# Patient Record
Sex: Male | Born: 1950 | Race: White | Hispanic: No | Marital: Married | Smoking: Never smoker
Health system: Southern US, Community
[De-identification: ages and names within clinical notes are randomized; demographics above are authoritative.]

## PROBLEM LIST (undated history)

## (undated) DIAGNOSIS — E119 Type 2 diabetes mellitus without complications: Secondary | ICD-10-CM

## (undated) DIAGNOSIS — M1990 Unspecified osteoarthritis, unspecified site: Secondary | ICD-10-CM

## (undated) DIAGNOSIS — M7022 Olecranon bursitis, left elbow: Principal | ICD-10-CM

## (undated) HISTORY — DX: Olecranon bursitis, left elbow: M70.22

## (undated) HISTORY — DX: Type 2 diabetes mellitus without complications: E11.9

---

## 2003-10-21 ENCOUNTER — Ambulatory Visit (HOSPITAL_COMMUNITY): Admission: RE | Admit: 2003-10-21 | Discharge: 2003-10-21 | Payer: Self-pay | Admitting: Internal Medicine

## 2015-03-24 ENCOUNTER — Other Ambulatory Visit: Payer: Self-pay | Admitting: Internal Medicine

## 2015-03-24 ENCOUNTER — Ambulatory Visit
Admission: RE | Admit: 2015-03-24 | Discharge: 2015-03-24 | Disposition: A | Discharge status: Home / Self Care | Payer: Federal, State, Local not specified - PPO | Source: Ambulatory Visit | Attending: Internal Medicine | Admitting: Internal Medicine

## 2015-03-24 DIAGNOSIS — M25511 Pain in right shoulder: Secondary | ICD-10-CM

## 2016-06-21 ENCOUNTER — Ambulatory Visit (INDEPENDENT_AMBULATORY_CARE_PROVIDER_SITE_OTHER): Payer: Federal, State, Local not specified - PPO | Admitting: Podiatry

## 2016-06-21 ENCOUNTER — Encounter: Payer: Self-pay | Admitting: Podiatry

## 2016-06-21 VITALS — BP 124/84 | HR 75 | Resp 18

## 2016-06-21 DIAGNOSIS — L89891 Pressure ulcer of other site, stage 1: Secondary | ICD-10-CM

## 2016-06-21 DIAGNOSIS — L97511 Non-pressure chronic ulcer of other part of right foot limited to breakdown of skin: Secondary | ICD-10-CM

## 2016-06-21 MED ORDER — MUPIROCIN 2 % EX OINT: 1.0000 "application " | TOPICAL_OINTMENT | Freq: Two times a day (BID) | CUTANEOUS | Status: DC

## 2016-06-21 MED ORDER — CEPHALEXIN 500 MG PO CAPS: 500.0000 mg | ORAL_CAPSULE | Freq: Three times a day (TID) | ORAL | Status: DC

## 2016-06-21 NOTE — Progress Notes (Signed)
   Subjective:    Patient ID: Lawrence Keith, male    DOB: 08/29/1951, 65 y.o.   MRN: [id number]  HPI  65 year old male presents the office today for concerns of a wound to the bottom of his right big toe which she just noticed yesterday. He states he had a blister to the area previously and yesterday he noticed that there was a wound there. He is unsure of how the blister started he is diabetic and his last A1c was 7.1. He does have a history of blistering on that foot as well previously. He does have numbness and tingling but is not taking any medication for neuropathy. Denies any claudication symptoms.   Review of Systems  All other systems reviewed and are negative.      Objective:   Physical Exam General: AAO x3, NAD  Dermatological: On the plantar aspect of the right hallux is a superficial granular ulceration with surrounding hyperkeratotic tissue. After debridement the area measures 0.8 x 0.8 cm. There is no fluctuance or crepitus. There is no malodor. There is no ascending synovitis. There is no probing, undermining or tunneling. There is no edema or erythema. No other open lesions or pre-ulcerative lesions identified this time.  Vascular: Dorsalis Pedis artery and Posterior Tibial artery pedal pulses are 2/4 bilateral with immedate capillary fill time. Pedal hair growth present. No varicosities and no lower extremity edema present bilateral. There is no pain with calf compression, swelling, warmth, erythema.   Neruologic: Sensation appears to be intact with Dorann OuSimms Weinstein monofilament as well as vibratory sensation. Does have subjective symptoms of neuropathy however.  Musculoskeletal: No gross boney pedal deformities bilateral. No pain, crepitus, or limitation noted with foot and ankle range of motion bilateral. Muscular strength 5/5 in all groups tested bilateral.  Gait: Unassisted, Nonantalgic.     Assessment & Plan:  65 year old male right plantar hallux ulceration due  to blister -Treatment options discussed including all alternatives, risks, and complications -Etiology of symptoms were discussed -Hyperkeratotic tissue and the wound was debrided to granular tissue with a scalpel. Continue daily dressing changes with the Pierson ointment. Prescribed Keflex. Monitor for signs or symptoms of infection and directed to the ER should any occur. Also discussed shoe gear modifications. -Follow-up as scheduled or sooner if any issues are to arise. In the meantime I encouraged him to call any questions, concerns or any change in symptoms.  Ovid CurdMatthew Wagoner, DPM

## 2016-06-24 ENCOUNTER — Encounter: Payer: Self-pay | Admitting: Podiatry

## 2016-07-05 ENCOUNTER — Ambulatory Visit: Payer: Federal, State, Local not specified - PPO | Admitting: Podiatry

## 2016-07-12 ENCOUNTER — Ambulatory Visit (INDEPENDENT_AMBULATORY_CARE_PROVIDER_SITE_OTHER): Payer: Federal, State, Local not specified - PPO | Admitting: Podiatry

## 2016-07-12 ENCOUNTER — Encounter: Payer: Self-pay | Admitting: Podiatry

## 2016-07-12 DIAGNOSIS — L97511 Non-pressure chronic ulcer of other part of right foot limited to breakdown of skin: Secondary | ICD-10-CM | POA: Diagnosis not present

## 2016-07-13 DIAGNOSIS — L97519 Non-pressure chronic ulcer of other part of right foot with unspecified severity: Secondary | ICD-10-CM | POA: Insufficient documentation

## 2016-07-13 NOTE — Progress Notes (Signed)
Subjective: 28 old male presents the office in follow-up evaluation of right hallux ulceration. He states that the area is much better. He states the areas almost cleaning his been cleaning the wound daily implying and buttock ointment and a bandage. Denies any edema, erythema or pain. No drainage or pus. Denies any systemic complaints such as fevers, chills, nausea, vomiting. No acute changes since last appointment, and no other complaints at this time.   Objective: AAO x3, NAD DP/PT pulses palpable bilaterally, CRT less than 3 seconds On the plantar aspect of the right hallux is a small superficial granular wound measuring approximately 0.2 x 0.27 m. There is no probing, undermining or tunneling. There is no edema, erythema, fluctuance, crepitus, malodor. No drainage or pus.  No edema, erythema, increase in warmth to bilateral lower extremities.  No open lesions or pre-ulcerative lesions.  No pain with calf compression, swelling, warmth, erythema  Assessment: Resolving ulceration right hallux to blister  Plan: -All treatment options discussed with the patient including all alternatives, risks, complications.  -There is a small amount of hyperkeratotic tissue around the wound which was debrided today. Recommend continue interbody ointment dressing changes daily. I modified his insert and take pressure off this area as well. Monitor for signs or symptoms of infection. The area is not healed within 3 weeks for follow-up or sooner if any issues are to arise. -Patient encouraged to call the office with any questions, concerns, change in symptoms.   Ovid Curd, DPM

## 2016-07-31 ENCOUNTER — Emergency Department (HOSPITAL_COMMUNITY): Payer: Federal, State, Local not specified - PPO

## 2016-07-31 ENCOUNTER — Observation Stay (HOSPITAL_COMMUNITY): Payer: Federal, State, Local not specified - PPO | Admitting: Anesthesiology

## 2016-07-31 ENCOUNTER — Inpatient Hospital Stay (HOSPITAL_COMMUNITY)
Admission: EM | Admit: 2016-07-31 | Discharge: 2016-08-03 | DRG: 419 | Disposition: A | Discharge status: Home / Self Care | Payer: Federal, State, Local not specified - PPO | Attending: Surgery | Admitting: Surgery

## 2016-07-31 ENCOUNTER — Encounter (HOSPITAL_COMMUNITY): Payer: Self-pay | Admitting: Emergency Medicine

## 2016-07-31 ENCOUNTER — Observation Stay (HOSPITAL_COMMUNITY): Payer: Federal, State, Local not specified - PPO

## 2016-07-31 ENCOUNTER — Encounter (HOSPITAL_COMMUNITY): Admission: EM | Disposition: A | Discharge status: Home / Self Care | Payer: Self-pay | Source: Home / Self Care

## 2016-07-31 DIAGNOSIS — K8020 Calculus of gallbladder without cholecystitis without obstruction: Secondary | ICD-10-CM | POA: Diagnosis not present

## 2016-07-31 DIAGNOSIS — K8066 Calculus of gallbladder and bile duct with acute and chronic cholecystitis without obstruction: Principal | ICD-10-CM | POA: Diagnosis present

## 2016-07-31 DIAGNOSIS — Z79899 Other long term (current) drug therapy: Secondary | ICD-10-CM

## 2016-07-31 DIAGNOSIS — E119 Type 2 diabetes mellitus without complications: Secondary | ICD-10-CM | POA: Diagnosis present

## 2016-07-31 DIAGNOSIS — Z7984 Long term (current) use of oral hypoglycemic drugs: Secondary | ICD-10-CM

## 2016-07-31 DIAGNOSIS — I10 Essential (primary) hypertension: Secondary | ICD-10-CM | POA: Diagnosis present

## 2016-07-31 DIAGNOSIS — Z419 Encounter for procedure for purposes other than remedying health state, unspecified: Secondary | ICD-10-CM

## 2016-07-31 DIAGNOSIS — Z7982 Long term (current) use of aspirin: Secondary | ICD-10-CM

## 2016-07-31 HISTORY — PX: CHOLECYSTECTOMY: SHX55

## 2016-07-31 LAB — COMPREHENSIVE METABOLIC PANEL
ALBUMIN: 4.1 g/dL (ref 3.5–5.0)
ALK PHOS: 51 U/L (ref 38–126)
ALT: 18 U/L (ref 17–63)
ANION GAP: 9 (ref 5–15)
AST: 20 U/L (ref 15–41)
BILIRUBIN TOTAL: 0.6 mg/dL (ref 0.3–1.2)
BUN: 19 mg/dL (ref 6–20)
CALCIUM: 9.3 mg/dL (ref 8.9–10.3)
CO2: 28 mmol/L (ref 22–32)
Chloride: 104 mmol/L (ref 101–111)
Creatinine, Ser: 1.25 mg/dL — ABNORMAL HIGH (ref 0.61–1.24)
GFR calc Af Amer: >60 mL/min (ref >60)
GFR calc non Af Amer: 60 mL/min — ABNORMAL LOW (ref >60)
GLUCOSE: 208 mg/dL — AB (ref 65–99)
Potassium: 3.6 mmol/L (ref 3.5–5.1)
Sodium: 141 mmol/L (ref 135–145)
TOTAL PROTEIN: 6.5 g/dL (ref 6.5–8.1)

## 2016-07-31 LAB — CBC
HCT: 41.8 % (ref 39.0–52.0)
HEMOGLOBIN: 13.7 g/dL (ref 13.0–17.0)
MCH: 30.4 pg (ref 26.0–34.0)
MCHC: 32.8 g/dL (ref 30.0–36.0)
MCV: 92.7 fL (ref 78.0–100.0)
Platelets: 184 10*3/uL (ref 150–400)
RBC: 4.51 MIL/uL (ref 4.22–5.81)
RDW: 12.3 % (ref 11.5–15.5)
WBC: 9.8 10*3/uL (ref 4.0–10.5)

## 2016-07-31 LAB — URINALYSIS, ROUTINE W REFLEX MICROSCOPIC
BILIRUBIN URINE: NEGATIVE
Glucose, UA: 250 mg/dL — AB
Hgb urine dipstick: NEGATIVE
KETONES UR: 15 mg/dL — AB
Leukocytes, UA: NEGATIVE
NITRITE: NEGATIVE
PROTEIN: NEGATIVE mg/dL
SPECIFIC GRAVITY, URINE: 1.025 (ref 1.005–1.030)
pH: 5 (ref 5.0–8.0)

## 2016-07-31 LAB — SURGICAL PCR SCREEN
MRSA, PCR: NEGATIVE
Staphylococcus aureus: NEGATIVE

## 2016-07-31 LAB — CBG MONITORING, ED: GLUCOSE-CAPILLARY: 236 mg/dL — AB (ref 65–99)

## 2016-07-31 LAB — GLUCOSE, CAPILLARY
GLUCOSE-CAPILLARY: 191 mg/dL — AB (ref 65–99)
GLUCOSE-CAPILLARY: 225 mg/dL — AB (ref 65–99)
Glucose-Capillary: 191 mg/dL — ABNORMAL HIGH (ref 65–99)
Glucose-Capillary: 256 mg/dL — ABNORMAL HIGH (ref 65–99)

## 2016-07-31 LAB — LIPASE, BLOOD: Lipase: 17 U/L (ref 11–51)

## 2016-07-31 SURGERY — LAPAROSCOPIC CHOLECYSTECTOMY WITH INTRAOPERATIVE CHOLANGIOGRAM
Anesthesia: General | Site: Abdomen

## 2016-07-31 MED ORDER — MUPIROCIN 2 % EX OINT: 1.0000 "application " | TOPICAL_OINTMENT | Freq: Two times a day (BID) | CUTANEOUS | Status: DC | PRN

## 2016-07-31 MED ORDER — DIPHENHYDRAMINE HCL 12.5 MG/5ML PO ELIX: 12.5000 mg | ORAL_SOLUTION | Freq: Four times a day (QID) | ORAL | Status: DC | PRN

## 2016-07-31 MED ORDER — ONDANSETRON 4 MG PO TBDP: 4.0000 mg | ORAL_TABLET | Freq: Four times a day (QID) | ORAL | Status: DC | PRN

## 2016-07-31 MED ORDER — ONDANSETRON HCL 4 MG/2ML IJ SOLN
INTRAMUSCULAR | Status: AC
  Administered 2016-07-31: 4 mg
  Filled 2016-07-31: qty 2

## 2016-07-31 MED ORDER — SODIUM CHLORIDE 0.9 % IV SOLN
INTRAVENOUS | Status: DC | PRN
  Administered 2016-07-31: 14:00:00
  Administered 2016-07-31: 100 mL

## 2016-07-31 MED ORDER — BUPIVACAINE-EPINEPHRINE (PF) 0.25% -1:200000 IJ SOLN
INTRAMUSCULAR | Status: AC
  Filled 2016-07-31: qty 30

## 2016-07-31 MED ORDER — DEXTROSE 5 % IV SOLN
2.0000 g | INTRAVENOUS | Status: DC
  Administered 2016-07-31: 2 g via INTRAVENOUS
  Filled 2016-07-31: qty 2

## 2016-07-31 MED ORDER — SUGAMMADEX SODIUM 200 MG/2ML IV SOLN
INTRAVENOUS | Status: DC | PRN
  Administered 2016-07-31: 180 mg via INTRAVENOUS

## 2016-07-31 MED ORDER — PROPOFOL 10 MG/ML IV BOLUS
INTRAVENOUS | Status: AC
  Filled 2016-07-31: qty 20

## 2016-07-31 MED ORDER — 0.9 % SODIUM CHLORIDE (POUR BTL) OPTIME
TOPICAL | Status: DC | PRN
  Administered 2016-07-31: 1000 mL

## 2016-07-31 MED ORDER — FENTANYL CITRATE (PF) 100 MCG/2ML IJ SOLN
INTRAMUSCULAR | Status: AC
  Filled 2016-07-31: qty 2

## 2016-07-31 MED ORDER — OXYCODONE HCL 5 MG PO TABS
5.0000 mg | ORAL_TABLET | ORAL | Status: DC | PRN
  Administered 2016-07-31 – 2016-08-02 (×4): 10 mg via ORAL
  Filled 2016-07-31 (×6): qty 2

## 2016-07-31 MED ORDER — BUPIVACAINE-EPINEPHRINE 0.25% -1:200000 IJ SOLN
INTRAMUSCULAR | Status: DC | PRN
  Administered 2016-07-31: 8 mL

## 2016-07-31 MED ORDER — GLIMEPIRIDE 2 MG PO TABS
2.0000 mg | ORAL_TABLET | Freq: Every day | ORAL | Status: DC
  Administered 2016-08-01 – 2016-08-03 (×3): 2 mg via ORAL
  Filled 2016-07-31 (×3): qty 1

## 2016-07-31 MED ORDER — MIDAZOLAM HCL 2 MG/2ML IJ SOLN
INTRAMUSCULAR | Status: AC
  Filled 2016-07-31: qty 2

## 2016-07-31 MED ORDER — FENTANYL CITRATE (PF) 100 MCG/2ML IJ SOLN
50.0000 ug | Freq: Once | INTRAMUSCULAR | Status: AC
  Administered 2016-07-31: 50 ug via INTRAVENOUS
  Filled 2016-07-31: qty 2

## 2016-07-31 MED ORDER — LIDOCAINE HCL (CARDIAC) 20 MG/ML IV SOLN
INTRAVENOUS | Status: DC | PRN
  Administered 2016-07-31: 60 mg via INTRAVENOUS

## 2016-07-31 MED ORDER — HEMOSTATIC AGENTS (NO CHARGE) OPTIME
TOPICAL | Status: DC | PRN
  Administered 2016-07-31: 1 via TOPICAL

## 2016-07-31 MED ORDER — MIDAZOLAM HCL 5 MG/5ML IJ SOLN
INTRAMUSCULAR | Status: DC | PRN
  Administered 2016-07-31 (×2): 1 mg via INTRAVENOUS

## 2016-07-31 MED ORDER — MORPHINE SULFATE (PF) 4 MG/ML IV SOLN
4.0000 mg | INTRAVENOUS | Status: AC
  Administered 2016-07-31: 4 mg via INTRAVENOUS
  Filled 2016-07-31: qty 1

## 2016-07-31 MED ORDER — PROPOFOL 10 MG/ML IV BOLUS
INTRAVENOUS | Status: DC | PRN
  Administered 2016-07-31: 40 mg via INTRAVENOUS
  Administered 2016-07-31: 160 mg via INTRAVENOUS

## 2016-07-31 MED ORDER — MORPHINE SULFATE (PF) 2 MG/ML IV SOLN
2.0000 mg | INTRAVENOUS | Status: DC | PRN
  Administered 2016-08-01 (×3): 4 mg via INTRAVENOUS
  Filled 2016-07-31 (×3): qty 2
  Filled 2016-07-31: qty 1

## 2016-07-31 MED ORDER — FENTANYL CITRATE (PF) 100 MCG/2ML IJ SOLN
INTRAMUSCULAR | Status: AC
  Filled 2016-07-31: qty 4

## 2016-07-31 MED ORDER — ONDANSETRON HCL 4 MG/2ML IJ SOLN
INTRAMUSCULAR | Status: DC | PRN
  Administered 2016-07-31: 4 mg via INTRAVENOUS

## 2016-07-31 MED ORDER — LIDOCAINE HCL 4 % EX SOLN
CUTANEOUS | Status: DC | PRN
  Administered 2016-07-31: 3 mL via TOPICAL

## 2016-07-31 MED ORDER — DEXAMETHASONE SODIUM PHOSPHATE 10 MG/ML IJ SOLN
INTRAMUSCULAR | Status: AC
  Filled 2016-07-31: qty 1

## 2016-07-31 MED ORDER — DEXTROSE 5 % IV SOLN
INTRAVENOUS | Status: DC | PRN
  Administered 2016-07-31: 25 ug/min via INTRAVENOUS

## 2016-07-31 MED ORDER — LACTATED RINGERS IV SOLN
INTRAVENOUS | Status: DC | PRN
  Administered 2016-07-31 (×2): via INTRAVENOUS

## 2016-07-31 MED ORDER — DIPHENHYDRAMINE HCL 50 MG/ML IJ SOLN: 12.5000 mg | Freq: Four times a day (QID) | INTRAMUSCULAR | Status: DC | PRN

## 2016-07-31 MED ORDER — CEFTRIAXONE SODIUM 2 G IJ SOLR
2.0000 g | INTRAMUSCULAR | Status: DC
  Administered 2016-07-31 – 2016-08-01 (×2): 2 g via INTRAVENOUS
  Filled 2016-07-31 (×3): qty 2

## 2016-07-31 MED ORDER — ONDANSETRON HCL 4 MG/2ML IJ SOLN
INTRAMUSCULAR | Status: AC
  Filled 2016-07-31: qty 2

## 2016-07-31 MED ORDER — HYDROMORPHONE HCL 1 MG/ML IJ SOLN: 0.2500 mg | INTRAMUSCULAR | Status: DC | PRN

## 2016-07-31 MED ORDER — SODIUM CHLORIDE 0.9 % IR SOLN
Status: DC | PRN
  Administered 2016-07-31: 1000 mL

## 2016-07-31 MED ORDER — FENTANYL CITRATE (PF) 100 MCG/2ML IJ SOLN
INTRAMUSCULAR | Status: DC | PRN
  Administered 2016-07-31 (×5): 50 ug via INTRAVENOUS

## 2016-07-31 MED ORDER — METFORMIN HCL 500 MG PO TABS
1000.0000 mg | ORAL_TABLET | Freq: Two times a day (BID) | ORAL | Status: DC
  Administered 2016-07-31 – 2016-08-03 (×5): 1000 mg via ORAL
  Filled 2016-07-31 (×5): qty 2

## 2016-07-31 MED ORDER — HYDROMORPHONE HCL 1 MG/ML IJ SOLN
1.0000 mg | INTRAMUSCULAR | Status: DC | PRN
  Administered 2016-07-31 (×2): 1 mg via INTRAVENOUS
  Filled 2016-07-31 (×2): qty 1

## 2016-07-31 MED ORDER — MEPERIDINE HCL 25 MG/ML IJ SOLN: 6.2500 mg | INTRAMUSCULAR | Status: DC | PRN

## 2016-07-31 MED ORDER — METOPROLOL TARTRATE 5 MG/5ML IV SOLN
INTRAVENOUS | Status: DC | PRN
  Administered 2016-07-31 (×2): 1 mg via INTRAVENOUS
  Administered 2016-07-31: 2 mg via INTRAVENOUS

## 2016-07-31 MED ORDER — LOSARTAN POTASSIUM 50 MG PO TABS
25.0000 mg | ORAL_TABLET | ORAL | Status: DC
  Administered 2016-08-01: 25 mg via ORAL
  Filled 2016-07-31 (×2): qty 1

## 2016-07-31 MED ORDER — INSULIN ASPART 100 UNIT/ML ~~LOC~~ SOLN
0.0000 [IU] | Freq: Every day | SUBCUTANEOUS | Status: DC
  Administered 2016-07-31: 3 [IU] via SUBCUTANEOUS
  Administered 2016-08-01: 2 [IU] via SUBCUTANEOUS

## 2016-07-31 MED ORDER — ONDANSETRON HCL 4 MG/2ML IJ SOLN
4.0000 mg | Freq: Four times a day (QID) | INTRAMUSCULAR | Status: DC | PRN
  Administered 2016-07-31: 4 mg via INTRAVENOUS
  Filled 2016-07-31: qty 2

## 2016-07-31 MED ORDER — ROCURONIUM BROMIDE 100 MG/10ML IV SOLN
INTRAVENOUS | Status: DC | PRN
  Administered 2016-07-31: 10 mg via INTRAVENOUS
  Administered 2016-07-31: 50 mg via INTRAVENOUS

## 2016-07-31 MED ORDER — SODIUM CHLORIDE 0.9 % IV SOLN
INTRAVENOUS | Status: DC
  Administered 2016-07-31 – 2016-08-01 (×2): via INTRAVENOUS

## 2016-07-31 MED ORDER — ONDANSETRON HCL 4 MG/2ML IJ SOLN
4.0000 mg | Freq: Four times a day (QID) | INTRAMUSCULAR | Status: DC | PRN
  Administered 2016-08-02 (×3): 4 mg via INTRAVENOUS
  Filled 2016-07-31 (×3): qty 2

## 2016-07-31 MED ORDER — LIDOCAINE 2% (20 MG/ML) 5 ML SYRINGE
INTRAMUSCULAR | Status: AC
  Filled 2016-07-31: qty 10

## 2016-07-31 MED ORDER — PROMETHAZINE HCL 25 MG/ML IJ SOLN: 6.2500 mg | INTRAMUSCULAR | Status: DC | PRN

## 2016-07-31 MED ORDER — INSULIN ASPART 100 UNIT/ML ~~LOC~~ SOLN
0.0000 [IU] | Freq: Three times a day (TID) | SUBCUTANEOUS | Status: DC
  Administered 2016-07-31 – 2016-08-01 (×3): 2 [IU] via SUBCUTANEOUS
  Administered 2016-08-02: 5 [IU] via SUBCUTANEOUS
  Administered 2016-08-02: 3 [IU] via SUBCUTANEOUS
  Administered 2016-08-02: 2 [IU] via SUBCUTANEOUS
  Administered 2016-08-03: 3 [IU] via SUBCUTANEOUS

## 2016-07-31 MED ORDER — ROCURONIUM BROMIDE 10 MG/ML (PF) SYRINGE
PREFILLED_SYRINGE | INTRAVENOUS | Status: AC
  Filled 2016-07-31: qty 10

## 2016-07-31 MED ORDER — IOPAMIDOL (ISOVUE-300) INJECTION 61%
INTRAVENOUS | Status: AC
  Filled 2016-07-31: qty 50

## 2016-07-31 SURGICAL SUPPLY — 46 items
APL SKNCLS STERI-STRIP NONHPOA (GAUZE/BANDAGES/DRESSINGS) ×1
APPLIER CLIP ROT 10 11.4 M/L (STAPLE) ×3
APR CLP MED LRG 11.4X10 (STAPLE) ×1
BAG SPEC RTRVL LRG 6X4 10 (ENDOMECHANICALS) ×1
BENZOIN TINCTURE PRP APPL 2/3 (GAUZE/BANDAGES/DRESSINGS) ×3 IMPLANT
BLADE SURG ROTATE 9660 (MISCELLANEOUS) IMPLANT
CANISTER SUCTION 2500CC (MISCELLANEOUS) ×3 IMPLANT
CHLORAPREP W/TINT 26ML (MISCELLANEOUS) ×3 IMPLANT
CLIP APPLIE ROT 10 11.4 M/L (STAPLE) ×1 IMPLANT
CLOSURE WOUND 1/2 X4 (GAUZE/BANDAGES/DRESSINGS) ×1
COVER MAYO STAND STRL (DRAPES) ×3 IMPLANT
COVER SURGICAL LIGHT HANDLE (MISCELLANEOUS) ×3 IMPLANT
DRAPE C-ARM 42X72 X-RAY (DRAPES) ×3 IMPLANT
DRSG TEGADERM 2-3/8X2-3/4 SM (GAUZE/BANDAGES/DRESSINGS) ×9 IMPLANT
DRSG TEGADERM 4X4.75 (GAUZE/BANDAGES/DRESSINGS) ×3 IMPLANT
ELECT REM PT RETURN 9FT ADLT (ELECTROSURGICAL) ×3
ELECTRODE REM PT RTRN 9FT ADLT (ELECTROSURGICAL) ×1 IMPLANT
FILTER SMOKE EVAC LAPAROSHD (FILTER) ×3 IMPLANT
GAUZE SPONGE 2X2 8PLY NS (GAUZE/BANDAGES/DRESSINGS) ×2 IMPLANT
GAUZE SPONGE 2X2 8PLY STRL LF (GAUZE/BANDAGES/DRESSINGS) ×1 IMPLANT
GLOVE BIO SURGEON STRL SZ7 (GLOVE) ×3 IMPLANT
GLOVE BIOGEL PI IND STRL 7.5 (GLOVE) ×1 IMPLANT
GLOVE BIOGEL PI INDICATOR 7.5 (GLOVE) ×2
GOWN STRL REUS W/ TWL LRG LVL3 (GOWN DISPOSABLE) ×3 IMPLANT
GOWN STRL REUS W/TWL LRG LVL3 (GOWN DISPOSABLE) ×9
HEMOSTAT SNOW SURGICEL 2X4 (HEMOSTASIS) ×2 IMPLANT
KIT BASIN OR (CUSTOM PROCEDURE TRAY) ×3 IMPLANT
KIT ROOM TURNOVER OR (KITS) ×3 IMPLANT
NS IRRIG 1000ML POUR BTL (IV SOLUTION) ×3 IMPLANT
PAD ARMBOARD 7.5X6 YLW CONV (MISCELLANEOUS) ×3 IMPLANT
POUCH SPECIMEN RETRIEVAL 10MM (ENDOMECHANICALS) ×3 IMPLANT
SCISSORS LAP 5X35 DISP (ENDOMECHANICALS) ×3 IMPLANT
SET CHOLANGIOGRAPH 5 50 .035 (SET/KITS/TRAYS/PACK) ×3 IMPLANT
SET IRRIG TUBING LAPAROSCOPIC (IRRIGATION / IRRIGATOR) ×3 IMPLANT
SLEEVE ENDOPATH XCEL 5M (ENDOMECHANICALS) ×3 IMPLANT
SPECIMEN JAR SMALL (MISCELLANEOUS) ×3 IMPLANT
SPONGE GAUZE 2X2 STER 10/PKG (GAUZE/BANDAGES/DRESSINGS) ×2
STRIP CLOSURE SKIN 1/2X4 (GAUZE/BANDAGES/DRESSINGS) ×2 IMPLANT
SUT MNCRL AB 4-0 PS2 18 (SUTURE) ×3 IMPLANT
TOWEL OR 17X24 6PK STRL BLUE (TOWEL DISPOSABLE) ×3 IMPLANT
TOWEL OR 17X26 10 PK STRL BLUE (TOWEL DISPOSABLE) ×3 IMPLANT
TRAY LAPAROSCOPIC MC (CUSTOM PROCEDURE TRAY) ×3 IMPLANT
TROCAR XCEL BLUNT TIP 100MML (ENDOMECHANICALS) ×3 IMPLANT
TROCAR XCEL NON-BLD 11X100MML (ENDOMECHANICALS) ×3 IMPLANT
TROCAR XCEL NON-BLD 5MMX100MML (ENDOMECHANICALS) ×3 IMPLANT
TUBING INSUFFLATION (TUBING) ×3 IMPLANT

## 2016-07-31 NOTE — ED Triage Notes (Signed)
Pt. reports right lateral abdominal pain with nausea and emesis onset this evening . Denies fever or diarrhea .

## 2016-07-31 NOTE — Op Note (Signed)
Laparoscopic Cholecystectomy with IOC Procedure Note  Indications: This patient presents with symptomatic gallbladder disease and will undergo laparoscopic cholecystectomy.  Pre-operative Diagnosis: Calculus of gallbladder with acute cholecystitis, without mention of obstruction  Post-operative Diagnosis: Calculus of gallbladder and bile duct with other cholecystitis without mention of obstruction  Surgeon: Soul Deveney K.   Assistants: none  Anesthesia: General endotracheal anesthesia  ASA Class: 2E  Procedure Details  The patient was seen again in the Holding Room. The risks, benefits, complications, treatment options, and expected outcomes were discussed with the patient. The possibilities of reaction to medication, pulmonary aspiration, perforation of viscus, bleeding, recurrent infection, finding a normal gallbladder, the need for additional procedures, failure to diagnose a condition, the possible need to convert to an open procedure, and creating a complication requiring transfusion or operation were discussed with the patient. The likelihood of improving the patient's symptoms with return to their baseline status is good.  The patient and/or family concurred with the proposed plan, giving informed consent. The site of surgery properly noted. The patient was taken to Operating Room, identified as Lawrence Keith and the procedure verified as Laparoscopic Cholecystectomy with Intraoperative Cholangiogram. A Time Out was held and the above information confirmed.  Prior to the induction of general anesthesia, antibiotic prophylaxis was administered. General endotracheal anesthesia was then administered and tolerated well. After the induction, the abdomen was prepped with Chloraprep and draped in the sterile fashion. The patient was positioned in the supine position.  Local anesthetic agent was injected into the skin near the umbilicus and an incision made. We dissected down to the  abdominal fascia with blunt dissection.  The fascia was incised vertically and we entered the peritoneal cavity bluntly.  A pursestring suture of 0-Vicryl was placed around the fascial opening.  The Hasson cannula was inserted and secured with the stay suture.  Pneumoperitoneum was then created with CO2 and tolerated well without any adverse changes in the patient's vital signs. An 11-mm port was placed in the subxiphoid position.  Two 5-mm ports were placed in the right upper quadrant. All skin incisions were infiltrated with a local anesthetic agent before making the incision and placing the trocars.   We positioned the patient in reverse Trendelenburg, tilted slightly to the patient's left.  The gallbladder was identified, the fundus grasped and retracted cephalad.  The gallbladder was quite inflamed and distended.  We decompressed it with the suction aspirator. Adhesions were lysed bluntly and with the electrocautery where indicated, taking care not to injure any adjacent organs or viscus. The infundibulum was grasped and retracted laterally, exposing the peritoneum overlying the triangle of Calot. This was then divided and exposed in a blunt fashion. A critical view of the cystic duct and cystic artery was obtained.  The cystic duct was clearly identified and bluntly dissected circumferentially. The cystic duct was ligated with a clip distally.   An incision was made in the cystic duct and the [facility] catheter introduced. The catheter was secured using a clip. A cholangiogram was then obtained which showed good visualization of the distal and proximal biliary tree.  There seemed to be a filling defect at the ampulla, but contrast flowed\ into the duodenum. The catheter was then removed.   The cystic duct was then ligated with clips and divided. The cystic artery was identified, dissected free, ligated with clips and divided as well.   The gallbladder was dissected from the liver bed in  retrograde fashion with the electrocautery. The gallbladder was  removed and placed in an Endocatch sac. The liver bed was irrigated and inspected. Hemostasis was achieved with the electrocautery. Copious irrigation was utilized and was repeatedly aspirated until clear.  The gallbladder and Endocatch sac were then removed through the umbilical port site.  The pursestring suture was used to close the umbilical fascia.    We again inspected the right upper quadrant for hemostasis.  Pneumoperitoneum was released as we removed the trocars.  4-0 Monocryl was used to close the skin.   Benzoin, steri-strips, and clean dressings were applied. The patient was then extubated and brought to the recovery room in stable condition. Instrument, sponge, and needle counts were correct at closure and at the conclusion of the case.   Findings: Cholecystitis with Cholelithiasis  Estimated Blood Loss: less than 100 mL         Drains: none         Specimens: Gallbladder           Complications: None; patient tolerated the procedure well.         Disposition: PACU - hemodynamically stable.         Condition: stable  Lawrence ArmsMatthew K. Corliss Skainssuei, MD, [facility]  General/ Trauma Surgery  07/31/2016 4:10 PM

## 2016-07-31 NOTE — Progress Notes (Signed)
Called to room by daughter stating that after I gave the insulin, patient became very diaphoretic . CBG rechecked and noted to be 225, patient is alert and oriented, not in any distress. VSS 107/64, HR 97,O2 sat 95%on o2 at 2LPM. Deinies any pain.Will endorse apprpriately.

## 2016-07-31 NOTE — ED Notes (Signed)
EDP at bedside  

## 2016-07-31 NOTE — Anesthesia Preprocedure Evaluation (Addendum)
Anesthesia Evaluation  Patient identified by MRN, date of birth, ID band Patient awake    Reviewed: Allergy & Precautions, NPO status , Patient's Chart, lab work & pertinent test results  Airway Mallampati: II  TM Distance: >3 FB Neck ROM: Full    Dental no notable dental hx. (+) Teeth Intact, Dental Advisory Given, Caps   Pulmonary neg pulmonary ROS,    Pulmonary exam normal breath sounds clear to auscultation       Cardiovascular hypertension, Normal cardiovascular exam Rhythm:Regular Rate:Normal     Neuro/Psych negative neurological ROS  negative psych ROS   GI/Hepatic negative GI ROS, Neg liver ROS,   Endo/Other  diabetes, Type 2, Oral Hypoglycemic Agents  Renal/GU negative Renal ROS     Musculoskeletal negative musculoskeletal ROS (+)   Abdominal   Peds  Hematology negative hematology ROS (+)   Anesthesia Other Findings   Reproductive/Obstetrics                            Anesthesia Physical Anesthesia Plan  ASA: II  Anesthesia Plan: General   Post-op Pain Management:    Induction: Intravenous  Airway Management Planned: Oral ETT  Additional Equipment:   Intra-op Plan:   Post-operative Plan: Extubation in OR  Informed Consent: I have reviewed the patients History and Physical, chart, labs and discussed the procedure including the risks, benefits and alternatives for the proposed anesthesia with the patient or authorized representative who has indicated his/her understanding and acceptance.   Dental advisory given  Plan Discussed with: CRNA  Anesthesia Plan Comments:         Anesthesia Quick Evaluation

## 2016-07-31 NOTE — Anesthesia Procedure Notes (Addendum)
Procedure Name: Intubation Date/Time: 07/31/2016 2:33 PM Performed by: Suzy Bouchard Pre-anesthesia Checklist: Patient identified, Emergency Drugs available, Suction available, Timeout performed and Patient being monitored Patient Re-evaluated:Patient Re-evaluated prior to inductionOxygen Delivery Method: Circle system utilized Preoxygenation: Pre-oxygenation with 100% oxygen Intubation Type: IV induction Ventilation: Mask ventilation without difficulty and Oral airway inserted - appropriate to patient size Laryngoscope Size: Sabra Heck and 2 Grade View: Grade I Tube type: Oral Laser Tube: Cuffed inflated with minimal occlusive pressure - saline Tube size: 7.5 mm Airway Equipment and Method: Stylet and LTA kit utilized Placement Confirmation: ETT inserted through vocal cords under direct vision,  positive ETCO2 and breath sounds checked- equal and bilateral Secured at: 22 cm Tube secured with: Tape Dental Injury: Teeth and Oropharynx as per pre-operative assessment

## 2016-07-31 NOTE — ED Notes (Signed)
Patient transported to ultrasound.

## 2016-07-31 NOTE — H&P (Signed)
Lawrence Keith is an 65 y.o. male.   PCP:  Myriam Jacobson, MD Chief Complaint: Acute onset of abdominal pain, nausea and vomiting last PM HPI: Pt presents with abd pain, nausea and vomiting , pain RUQ and epigastric area. Pain started around 12:30 AM.  He took a Zantac without effect.  About 3 AM he started having nausea and vomiting with the pain.  He has the pain 3-4 months ago but it got better in 1-2 hours.  Work up in the ED shows he is afebrile, with BP up on arrival.  Labs show elevated creatinine, and glucose, other labs are normal. Ultrasound shows:  There is cholelithiasis and gallbladder sludge. The largest stone measures 1.7 cm and is nonmobile, located at the gallbladder neck. Sonographic Percell Miller sign could not be assessed because the patient had an given pain medicine. Gallbladder wall thickness is normal, measuring 2.7 mm. No pericholecystic fluid.  Common bile duct: Diameter: 4.6 mm, normal  He continues to have pain and we are ask to see.   Past Medical History:  Diagnosis Date  . Diabetes mellitus without complication ([place])     History reviewed. No pertinent surgical history.     No family history on file. Social History:  reports that he has never smoked. He does not have any smokeless tobacco history on file. He reports that he does not drink alcohol or use drugs. Tobacco:  None ETOH:   Social in the past Drugs: none Works in [employer], wife is with him.    Allergies:  None   Prior to Admission medications   Medication Sig Start Date End Date Taking? Authorizing Provider  ALPRAZolam Duanne Moron) 0.5 MG tablet  06/03/16   Historical Provider, MD  aspirin 81 MG chewable tablet Chew by mouth.    Historical Provider, MD  cephALEXin (KEFLEX) 500 MG capsule Take 1 capsule (500 mg total) by mouth 3 (three) times daily. 06/21/16   Trula Slade, DPM  glimepiride (AMARYL) 2 MG tablet  04/19/16   Historical Provider, MD  losartan (COZAAR) 25 MG tablet  04/19/16    Historical Provider, MD  metFORMIN (GLUCOPHAGE) 1000 MG tablet  04/19/16   Historical Provider, MD  mupirocin ointment (BACTROBAN) 2 % Apply 1 application topically 2 (two) times daily. 06/21/16   Trula Slade, DPM     Results for orders placed or performed during the hospital encounter of 07/31/16 (from the past 48 hour(s))  Urinalysis, Routine w reflex microscopic     Status: Abnormal   Collection Time: 07/31/16  4:09 AM  Result Value Ref Range   Color, Urine YELLOW YELLOW   APPearance CLEAR CLEAR   Specific Gravity, Urine 1.025 1.005 - 1.030   pH 5.0 5.0 - 8.0   Glucose, UA 250 (A) NEGATIVE mg/dL   Hgb urine dipstick NEGATIVE NEGATIVE   Bilirubin Urine NEGATIVE NEGATIVE   Ketones, ur 15 (A) NEGATIVE mg/dL   Protein, ur NEGATIVE NEGATIVE mg/dL   Nitrite NEGATIVE NEGATIVE   Leukocytes, UA NEGATIVE NEGATIVE    Comment: MICROSCOPIC NOT DONE ON URINES WITH NEGATIVE PROTEIN, BLOOD, LEUKOCYTES, NITRITE, OR GLUCOSE <1000 mg/dL.  Lipase, blood     Status: None   Collection Time: 07/31/16  4:12 AM  Result Value Ref Range   Lipase 17 11 - 51 U/L  Comprehensive metabolic panel     Status: Abnormal   Collection Time: 07/31/16  4:12 AM  Result Value Ref Range   Sodium 141 135 - 145 mmol/L  Potassium 3.6 3.5 - 5.1 mmol/L   Chloride 104 101 - 111 mmol/L   CO2 28 22 - 32 mmol/L   Glucose, Bld 208 (H) 65 - 99 mg/dL   BUN 19 6 - 20 mg/dL   Creatinine, Ser 1.25 (H) 0.61 - 1.24 mg/dL   Calcium 9.3 8.9 - 10.3 mg/dL   Total Protein 6.5 6.5 - 8.1 g/dL   Albumin 4.1 3.5 - 5.0 g/dL   AST 20 15 - 41 U/L   ALT 18 17 - 63 U/L   Alkaline Phosphatase 51 38 - 126 U/L   Total Bilirubin 0.6 0.3 - 1.2 mg/dL   GFR calc non Af Amer 60 (L) >60 mL/min   GFR calc Af Amer >60 >60 mL/min    Comment: (NOTE) The eGFR has been calculated using the CKD EPI equation. This calculation has not been validated in all clinical situations. eGFR's persistently <60 mL/min signify possible Chronic Kidney Disease.     Anion gap 9 5 - 15  CBC     Status: None   Collection Time: 07/31/16  4:12 AM  Result Value Ref Range   WBC 9.8 4.0 - 10.5 K/uL   RBC 4.51 4.22 - 5.81 MIL/uL   Hemoglobin 13.7 13.0 - 17.0 g/dL   HCT 41.8 39.0 - 52.0 %   MCV 92.7 78.0 - 100.0 fL   MCH 30.4 26.0 - 34.0 pg   MCHC 32.8 30.0 - 36.0 g/dL   RDW 12.3 11.5 - 15.5 %   Platelets 184 150 - 400 K/uL   US Abdomen Complete  Result Date: 07/31/2016 CLINICAL DATA:  Abdominal pain for 4 hours EXAM: ABDOMEN ULTRASOUND COMPLETE COMPARISON:  None. FINDINGS: Gallbladder: There is cholelithiasis and gallbladder sludge. The largest stone measures 1.7 cm and is nonmobile, located at the gallbladder neck. Sonographic Percell Miller sign could not be assessed because the patient had an given pain medicine. Gallbladder wall thickness is normal, measuring 2.7 mm. No pericholecystic fluid. Common bile duct: Diameter: 4.6 mm, normal Liver: No focal lesion identified. Within normal limits in parenchymal echogenicity. IVC: No abnormality visualized. Pancreas: Visualized portion unremarkable. Spleen: Size and appearance within normal limits. Length measures 6 cm. Right Kidney: Length: 12.1 cm. Echogenicity within normal limits. No mass or hydronephrosis visualized. Left Kidney: Length: 11.6 cm. Echogenicity within normal limits. No mass or hydronephrosis visualized. Abdominal aorta: No aneurysm visualized. AP diameter measures 2.6 cm Other findings: None. IMPRESSION: 1. 1.7 cm calculus at the gallbladder neck. No pericholecystic fluid or gallbladder wall thickening. 2. Otherwise normal abdominal ultrasound. Electronically Signed   By: Ulyses Jarred M.D.   On: 07/31/2016 06:48    Review of Systems  Constitutional: Negative for chills, diaphoresis, fever, malaise/fatigue and weight loss.  HENT: Negative.   Eyes: Negative.   Respiratory: Negative.   Cardiovascular: Negative.   Gastrointestinal: Positive for abdominal pain, nausea and vomiting. Negative for blood in  stool, constipation, diarrhea and heartburn.  Genitourinary:       Has trouble emptying bladder and slow voiding now.  Musculoskeletal:       Some issues with left knee and right foot.    Skin: Negative.   Neurological: Negative.  Negative for weakness.  Endo/Heme/Allergies: Negative.   Psychiatric/Behavioral: Negative.     Blood pressure 124/65, pulse (!) 51, temperature 97.9 F (36.6 C), temperature source Oral, resp. rate 19, height _0  (1.905 m), weight 91.6 kg (202 lb), SpO2 97 %. Physical Exam  Constitutional: He is oriented to person, place, and  time. He appears well-developed and well-nourished. No distress.  HENT:  Head: Normocephalic and atraumatic.  Nose: Nose normal.  Mouth/Throat: No oropharyngeal exudate.  Eyes: Right eye exhibits no discharge. Left eye exhibits no discharge. No scleral icterus.  Neck: Normal range of motion. Neck supple. No JVD present. No tracheal deviation present. No thyromegaly present.  Cardiovascular: Normal rate, regular rhythm, normal heart sounds and intact distal pulses.   No murmur heard. Some PAC's  Respiratory: Effort normal and breath sounds normal. No respiratory distress. He has no wheezes. He has no rales. He exhibits no tenderness.  GI: Soft. Bowel sounds are normal. He exhibits no distension and no mass. There is tenderness (RUQ). There is no rebound and no guarding.  Musculoskeletal: He exhibits no edema.  Lymphadenopathy:    He has no cervical adenopathy.  Neurological: He is alert and oriented to person, place, and time. No cranial nerve deficit.  Skin: Skin is warm and dry. No rash noted. He is not diaphoretic. No erythema. No pallor.  Psychiatric: He has a normal mood and affect. His behavior is normal. Judgment and thought content normal.     Assessment/Plan Symptomatic cholelithiasis, cholecystitis AODM Hypertension  Plan:  Admit, NPO for now, check and see if we can get him on for surgery later today or  tomorrow.    Leiah Giannotti, PA-C 07/31/2016, 7:59 AM

## 2016-07-31 NOTE — ED Provider Notes (Signed)
Patient seen/examined in the Emergency Department in conjunction with Midlevel Provider  Patient reports RUQ abdominal pain Exam : awake/alert, RUQ tenderness noted Plan: US findings reveals cholelithiasis with stone in neck of gallbladder Pt still with pain Will consult surgery    Lawrence Rhineonald Dariya Gainer, MD 07/31/16 [id number]

## 2016-07-31 NOTE — Anesthesia Postprocedure Evaluation (Signed)
Anesthesia Post Note  Patient: Lawrence Keith  Procedure(s) Performed: Procedure(s) (LRB): LAPAROSCOPIC CHOLECYSTECTOMY WITH INTRAOPERATIVE CHOLANGIOGRAM (N/A)  Patient location during evaluation: PACU Anesthesia Type: General Level of consciousness: sedated and patient cooperative Pain management: pain level controlled Vital Signs Assessment: post-procedure vital signs reviewed and stable Respiratory status: spontaneous breathing Cardiovascular status: stable Anesthetic complications: no    Last Vitals:  Vitals:   07/31/16 1649 07/31/16 1713  BP: 130/75 134/76  Pulse: (!) 107 98  Resp: 15   Temp: 37 C 36.8 C    Last Pain:  Vitals:   07/31/16 1713  TempSrc: Oral  PainSc:                  Lawrence Keith

## 2016-07-31 NOTE — ED Notes (Signed)
Pt returned from ultrasound

## 2016-07-31 NOTE — Transfer of Care (Signed)
Immediate Anesthesia Transfer of Care Note  Patient: Lawrence Keith  Procedure(s) Performed: Procedure(s): LAPAROSCOPIC CHOLECYSTECTOMY WITH INTRAOPERATIVE CHOLANGIOGRAM (N/A)  Patient Location: PACU  Anesthesia Type:General  Level of Consciousness: awake, alert  and oriented  Airway & Oxygen Therapy: Patient connected to nasal cannula oxygen  Post-op Assessment: Report given to RN and Post -op Vital signs reviewed and stable  Post vital signs: Reviewed and stable  Last Vitals:  Vitals:   07/31/16 1233 07/31/16 1604  BP: 134/65 105/89  Pulse: 92 (!) 105  Resp:  (!) 25  Temp: 36.8 C (P) 36.6 C    Last Pain:  Vitals:   07/31/16 1233  TempSrc: Oral  PainSc:          Complications: No apparent anesthesia complications

## 2016-07-31 NOTE — ED Notes (Signed)
Was out at nurse first, pt was throwing up in waiting room so immediately brought pt back to room, pt reports RUQ abd pain, pt diaphoretic, pale. IV started, 4mg  zofran override and given, 1L NS started. EKG was obtained and [facility] to [facility]. Melvenia BeamShari PA at bedside.

## 2016-07-31 NOTE — ED Provider Notes (Signed)
[facility]-EMERGENCY DEPT Provider Note   CSN: [id number] Arrival date & time: 07/31/16  0402     History   Chief Complaint Chief Complaint  Patient presents with  . Abdominal Pain    HPI Lawrence Keith is a 65 y.o. male.  Patient with a history of diabetes presents with abdominal pain, nausea and vomiting. No fever or diarrhea. Pain is started in the right lateral abdomen and is now in the right upper quadrant and epigastrium. It does not radiate into the back. No urinary symptoms. He reports similar pain in the past that was short lived and not as intense. No aggravating or alleviating factors.    The history is provided by the patient and a relative. No language interpreter was used.  Abdominal Pain   This is a new problem. Associated symptoms include nausea and vomiting. Pertinent negatives include diarrhea, constipation and dysuria.    Past Medical History:  Diagnosis Date  . Diabetes mellitus without complication [facility](HCC)     Patient Active Problem List   Diagnosis Date Noted  . Toe ulcer, right (HCC) 07/13/2016    History reviewed. No pertinent surgical history.     Home Medications    Prior to Admission medications   Medication Sig Start Date End Date Taking? Authorizing Provider  ALPRAZolam Prudy Feeler(XANAX) 0.5 MG tablet  06/03/16   Historical Provider, MD  aspirin 81 MG chewable tablet Chew by mouth.    Historical Provider, MD  cephALEXin (KEFLEX) 500 MG capsule Take 1 capsule (500 mg total) by mouth 3 (three) times daily. 06/21/16   Vivi BarrackMatthew R Wagoner, DPM  glimepiride (AMARYL) 2 MG tablet  04/19/16   Historical Provider, MD  losartan (COZAAR) 25 MG tablet  04/19/16   Historical Provider, MD  metFORMIN (GLUCOPHAGE) 1000 MG tablet  04/19/16   Historical Provider, MD  mupirocin ointment (BACTROBAN) 2 % Apply 1 application topically 2 (two) times daily. 06/21/16   Vivi BarrackMatthew R Wagoner, DPM    Family History No family history on file.  Social History Social History  Substance  Use Topics  . Smoking status: Never Smoker  . Smokeless tobacco: Not on file  . Alcohol use No     Allergies   Review of patient's allergies indicates no known allergies.   Review of Systems Review of Systems  Constitutional: Positive for diaphoresis.  Respiratory: Negative for shortness of breath.   Cardiovascular: Negative for chest pain.  Gastrointestinal: Positive for abdominal pain, nausea and vomiting. Negative for constipation and diarrhea.  Genitourinary: Negative for dysuria.  Musculoskeletal: Negative for back pain.  Skin: Positive for pallor.  Neurological: Negative for syncope.     Physical Exam Updated Vital Signs BP 157/85 (BP Location: Left Arm)   Pulse 60   Temp 97.9 F (36.6 C) (Oral)   Resp 14   Ht 6\' 3"  (1.905 m)   Wt 91.6 kg   SpO2 98%   BMI 25.25 kg/m   Physical Exam  Constitutional: He is oriented to person, place, and time. He appears well-developed and well-nourished.  HENT:  Head: Normocephalic.  Neck: Normal range of motion. Neck supple.  Cardiovascular: Normal rate and regular rhythm.   No murmur heard. Pulmonary/Chest: Effort normal and breath sounds normal. He has no wheezes. He has no rales.  Abdominal: Bowel sounds are normal. There is tenderness (RUQ > epigastric tenderness). There is guarding. There is no rebound.  Musculoskeletal: Normal range of motion.  Neurological: He is alert and oriented to person, place, and time.  Skin: Skin is warm. No rash noted. He is diaphoretic. There is pallor.  Psychiatric: He has a normal mood and affect.     ED Treatments / Results  Labs (all labs ordered are listed, but only abnormal results are displayed) Labs Reviewed  COMPREHENSIVE METABOLIC PANEL - Abnormal; Notable for the following:       Result Value   Glucose, Bld 208 (*)    Creatinine, Ser 1.25 (*)    GFR calc non Af Amer 60 (*)    All other components within normal limits  URINALYSIS, ROUTINE W REFLEX MICROSCOPIC (NOT AT [facility])  - Abnormal; Notable for the following:    Glucose, UA 250 (*)    Ketones, ur 15 (*)    All other components within normal limits  LIPASE, BLOOD  CBC    EKG  EKG Interpretation  Date/Time:  Saturday July 31 2016 05:15:36 EDT Ventricular Rate:  59 PR Interval:    QRS Duration: 88 QT Interval:  415 QTC Calculation: 412 R Axis:   40 Text Interpretation:  Sinus rhythm Normal ECG No previous ECGs available Confirmed by Bebe ShaggyWICKLINE  MD, DONALD ([id number]) on 07/31/2016 5:18:00 AM       Radiology No results found.  Procedures Procedures (including critical care time)  Medications Ordered in ED Medications  ondansetron (ZOFRAN) 4 MG/2ML injection (4 mg  Given 07/31/16 0529)  morphine 4 MG/ML injection 4 mg (4 mg Intravenous Given 07/31/16 0529)     Initial Impression / Assessment and Plan / ED Course  I have reviewed the triage vital signs and the nursing notes.  Pertinent labs & imaging results that were available during my care of the patient were reviewed by me and considered in my medical decision making (see chart for details).  Clinical Course    Patient presents with RUQ abdominal pain, N, V. Gall Bladder US showing gall bladder neck stone, mildly elevated transaminases. Patient is comfortable but has persistent tenderness. Will consult surgery for admission.   Final Clinical Impressions(s) / ED Diagnoses   Final diagnoses:  None   1. Cholelithiasis  New Prescriptions New Prescriptions   No medications on file     Elpidio AnisShari Lexianna Weinrich, Cordelia Poche-C 08/11/16 0443    Zadie Rhineonald Wickline, MD 08/11/16 1450

## 2016-07-31 NOTE — ED Notes (Signed)
Dr. Wickline at bedside.  

## 2016-08-01 ENCOUNTER — Encounter (HOSPITAL_COMMUNITY): Payer: Self-pay | Admitting: Gastroenterology

## 2016-08-01 ENCOUNTER — Encounter (HOSPITAL_COMMUNITY): Admission: EM | Disposition: A | Discharge status: Home / Self Care | Payer: Self-pay | Source: Home / Self Care

## 2016-08-01 ENCOUNTER — Inpatient Hospital Stay (HOSPITAL_COMMUNITY): Payer: Federal, State, Local not specified - PPO

## 2016-08-01 ENCOUNTER — Observation Stay (HOSPITAL_COMMUNITY): Payer: Federal, State, Local not specified - PPO | Admitting: Anesthesiology

## 2016-08-01 DIAGNOSIS — E119 Type 2 diabetes mellitus without complications: Secondary | ICD-10-CM | POA: Diagnosis present

## 2016-08-01 DIAGNOSIS — Z7982 Long term (current) use of aspirin: Secondary | ICD-10-CM | POA: Diagnosis not present

## 2016-08-01 DIAGNOSIS — Z7984 Long term (current) use of oral hypoglycemic drugs: Secondary | ICD-10-CM | POA: Diagnosis not present

## 2016-08-01 DIAGNOSIS — I10 Essential (primary) hypertension: Secondary | ICD-10-CM | POA: Diagnosis present

## 2016-08-01 DIAGNOSIS — Z79899 Other long term (current) drug therapy: Secondary | ICD-10-CM | POA: Diagnosis not present

## 2016-08-01 DIAGNOSIS — K8020 Calculus of gallbladder without cholecystitis without obstruction: Secondary | ICD-10-CM | POA: Diagnosis present

## 2016-08-01 DIAGNOSIS — K8066 Calculus of gallbladder and bile duct with acute and chronic cholecystitis without obstruction: Secondary | ICD-10-CM | POA: Diagnosis present

## 2016-08-01 HISTORY — PX: ERCP: SHX5425

## 2016-08-01 LAB — CBC
HEMATOCRIT: 38.7 % — AB (ref 39.0–52.0)
Hemoglobin: 12.5 g/dL — ABNORMAL LOW (ref 13.0–17.0)
MCH: 30.4 pg (ref 26.0–34.0)
MCHC: 32.3 g/dL (ref 30.0–36.0)
MCV: 94.2 fL (ref 78.0–100.0)
Platelets: 142 10*3/uL — ABNORMAL LOW (ref 150–400)
RBC: 4.11 MIL/uL — ABNORMAL LOW (ref 4.22–5.81)
RDW: 12.8 % (ref 11.5–15.5)
WBC: 9.4 10*3/uL (ref 4.0–10.5)

## 2016-08-01 LAB — COMPREHENSIVE METABOLIC PANEL
ALK PHOS: 55 U/L (ref 38–126)
ALT: 204 U/L — ABNORMAL HIGH (ref 17–63)
ANION GAP: 10 (ref 5–15)
AST: 218 U/L — ABNORMAL HIGH (ref 15–41)
Albumin: 3.2 g/dL — ABNORMAL LOW (ref 3.5–5.0)
BUN: 15 mg/dL (ref 6–20)
CALCIUM: 8.3 mg/dL — AB (ref 8.9–10.3)
CHLORIDE: 99 mmol/L — AB (ref 101–111)
CO2: 29 mmol/L (ref 22–32)
CREATININE: 1.26 mg/dL — AB (ref 0.61–1.24)
GFR calc non Af Amer: 59 mL/min — ABNORMAL LOW (ref >60)
Glucose, Bld: 174 mg/dL — ABNORMAL HIGH (ref 65–99)
POTASSIUM: 4.3 mmol/L (ref 3.5–5.1)
SODIUM: 138 mmol/L (ref 135–145)
Total Bilirubin: 0.8 mg/dL (ref 0.3–1.2)
Total Protein: 5.5 g/dL — ABNORMAL LOW (ref 6.5–8.1)

## 2016-08-01 LAB — GLUCOSE, CAPILLARY
GLUCOSE-CAPILLARY: 154 mg/dL — AB (ref 65–99)
GLUCOSE-CAPILLARY: 151 mg/dL — AB (ref 65–99)
Glucose-Capillary: 182 mg/dL — ABNORMAL HIGH (ref 65–99)
Glucose-Capillary: 150 mg/dL — ABNORMAL HIGH (ref 65–99)
Glucose-Capillary: 209 mg/dL — ABNORMAL HIGH (ref 65–99)

## 2016-08-01 SURGERY — ERCP, WITH INTERVENTION IF INDICATED
Anesthesia: General

## 2016-08-01 MED ORDER — ONDANSETRON HCL 4 MG/2ML IJ SOLN
INTRAMUSCULAR | Status: DC | PRN
  Administered 2016-08-01: 4 mg via INTRAVENOUS

## 2016-08-01 MED ORDER — KCL IN DEXTROSE-NACL 20-5-0.45 MEQ/L-%-% IV SOLN
INTRAVENOUS | Status: DC
  Administered 2016-08-01 – 2016-08-02 (×5): via INTRAVENOUS
  Filled 2016-08-01 (×7): qty 1000

## 2016-08-01 MED ORDER — LACTATED RINGERS IV SOLN
INTRAVENOUS | Status: DC | PRN
  Administered 2016-08-01: 18:00:00 via INTRAVENOUS

## 2016-08-01 MED ORDER — LIDOCAINE HCL (CARDIAC) 20 MG/ML IV SOLN
INTRAVENOUS | Status: DC | PRN
  Administered 2016-08-01: 80 mg via INTRATRACHEAL

## 2016-08-01 MED ORDER — PROMETHAZINE HCL 25 MG/ML IJ SOLN: 6.2500 mg | INTRAMUSCULAR | Status: DC | PRN

## 2016-08-01 MED ORDER — GLUCAGON HCL RDNA (DIAGNOSTIC) 1 MG IJ SOLR
INTRAMUSCULAR | Status: AC
  Filled 2016-08-01: qty 1

## 2016-08-01 MED ORDER — FENTANYL CITRATE (PF) 100 MCG/2ML IJ SOLN: 25.0000 ug | INTRAMUSCULAR | Status: DC | PRN

## 2016-08-01 MED ORDER — LOSARTAN POTASSIUM 50 MG PO TABS
25.0000 mg | ORAL_TABLET | Freq: Every day | ORAL | Status: DC
  Administered 2016-08-02 – 2016-08-03 (×2): 25 mg via ORAL
  Filled 2016-08-01 (×2): qty 1

## 2016-08-01 MED ORDER — SUCCINYLCHOLINE CHLORIDE 20 MG/ML IJ SOLN
INTRAMUSCULAR | Status: DC | PRN
  Administered 2016-08-01: 120 mg via INTRAVENOUS

## 2016-08-01 MED ORDER — IOPAMIDOL (ISOVUE-300) INJECTION 61%
INTRAVENOUS | Status: AC
  Filled 2016-08-01: qty 50

## 2016-08-01 MED ORDER — PROPOFOL 10 MG/ML IV BOLUS
INTRAVENOUS | Status: DC | PRN
  Administered 2016-08-01: 150 mg via INTRAVENOUS

## 2016-08-01 MED ORDER — FENTANYL CITRATE (PF) 250 MCG/5ML IJ SOLN
INTRAMUSCULAR | Status: DC | PRN
  Administered 2016-08-01: 100 ug via INTRAVENOUS

## 2016-08-01 NOTE — Anesthesia Preprocedure Evaluation (Addendum)
Anesthesia Evaluation  Patient identified by MRN, date of birth, ID band Patient awake    Reviewed: Allergy & Precautions, NPO status , Patient's Chart, lab work & pertinent test results  Airway Mallampati: II  TM Distance: >3 FB Neck ROM: Full    Dental no notable dental hx. (+) Teeth Intact, Dental Advisory Given, Caps   Pulmonary neg pulmonary ROS,    Pulmonary exam normal breath sounds clear to auscultation       Cardiovascular hypertension, Normal cardiovascular exam Rhythm:Regular Rate:Normal     Neuro/Psych negative neurological ROS  negative psych ROS   GI/Hepatic negative GI ROS, Neg liver ROS,   Endo/Other  diabetes, Type 2, Oral Hypoglycemic Agents  Renal/GU Renal InsufficiencyRenal disease     Musculoskeletal negative musculoskeletal ROS (+)   Abdominal   Peds  Hematology negative hematology ROS (+)   Anesthesia Other Findings   Reproductive/Obstetrics                             Lab Results  Component Value Date   WBC 9.4 08/01/2016   HGB 12.5 (L) 08/01/2016   HCT 38.7 (L) 08/01/2016   MCV 94.2 08/01/2016   PLT 142 (L) 08/01/2016   Lab Results  Component Value Date   CREATININE 1.26 (H) 08/01/2016   BUN 15 08/01/2016   NA 138 08/01/2016   K 4.3 08/01/2016   CL 99 (L) 08/01/2016   CO2 29 08/01/2016    Anesthesia Physical  Anesthesia Plan  ASA: II  Anesthesia Plan: General   Post-op Pain Management:    Induction: Intravenous  Airway Management Planned: Oral ETT  Additional Equipment:   Intra-op Plan:   Post-operative Plan: Extubation in OR  Informed Consent: I have reviewed the patients History and Physical, chart, labs and discussed the procedure including the risks, benefits and alternatives for the proposed anesthesia with the patient or authorized representative who has indicated his/her understanding and acceptance.   Dental advisory  given  Plan Discussed with: CRNA, Anesthesiologist and Surgeon  Anesthesia Plan Comments:        Anesthesia Quick Evaluation

## 2016-08-01 NOTE — Progress Notes (Signed)
Patient transported to Endo for procedure.

## 2016-08-01 NOTE — Anesthesia Postprocedure Evaluation (Signed)
Anesthesia Post Note  Patient: Lawrence Keith  Procedure(s) Performed: Procedure(s) (LRB): ENDOSCOPIC RETROGRADE CHOLANGIOPANCREATOGRAPHY (ERCP) (N/A)  Patient location during evaluation: PACU Anesthesia Type: General Level of consciousness: awake and alert Pain management: pain level controlled Vital Signs Assessment: post-procedure vital signs reviewed and stable Respiratory status: spontaneous breathing, nonlabored ventilation, respiratory function stable and patient connected to nasal cannula oxygen Cardiovascular status: blood pressure returned to baseline and stable Postop Assessment: no signs of nausea or vomiting Anesthetic complications: no    Last Vitals:  Vitals:   08/01/16 1928 08/01/16 1945  BP: 136/81 139/73  Pulse:  (!) 102  Resp: 14 17  Temp: 37.6 C 36.8 C    Last Pain:  Vitals:   08/01/16 2000  TempSrc:   PainSc: 4                  Kennieth RadFitzgerald, Zury Fazzino E

## 2016-08-01 NOTE — Transfer of Care (Signed)
Immediate Anesthesia Transfer of Care Note  Patient: Lawrence Keith  Procedure(s) Performed: Procedure(s): ENDOSCOPIC RETROGRADE CHOLANGIOPANCREATOGRAPHY (ERCP) (N/A)  Patient Location: PACU  Anesthesia Type:General  Level of Consciousness: awake, alert  and oriented  Airway & Oxygen Therapy: Patient Spontanous Breathing and Patient connected to nasal cannula oxygen  Post-op Assessment: Report given to RN and Post -op Vital signs reviewed and stable  Post vital signs: Reviewed and stable  Last Vitals:  Vitals:   08/01/16 1807 08/01/16 1905  BP:  (!) (P) 144/78  Pulse:    Resp:  (P) 18  Temp: 37.1 C (P) 36.7 C    Last Pain:  Vitals:   08/01/16 1807  TempSrc:   PainSc: 2          Complications: No apparent anesthesia complications

## 2016-08-01 NOTE — Op Note (Signed)
[facility] Patient Name: Lawrence RippleRichard Keith Procedure Date : 08/01/2016 MRN: [id number] Attending MD: Vida RiggerMarc Azie Mcconahy , MD Date of Birth: 05/14/1951 CSN: [id number] Age: 65 Admit Type: Inpatient Procedure:                ERCP Indications:              Suspected bile duct stone(s) on Intra-Op                            cholangiogram Providers:                Vida RiggerMarc Saige Busby, MD, Anthony Saraniel Madden, RN, Beryle BeamsJanie Billups,                            Technician, Linde Gillisebecca Mahoney, CRNA Referring MD:              Medicines:                General Anesthesia Complications:            No immediate complications. Estimated Blood Loss:     Estimated blood loss: none. Procedure:                Pre-Anesthesia Assessment:                           - Prior to the procedure, a History and Physical                            was performed, and patient medications and                            allergies were reviewed. The patient's tolerance of                            previous anesthesia was also reviewed. The risks                            and benefits of the procedure and the sedation                            options and risks were discussed with the patient.                            All questions were answered, and informed consent                            was obtained. Prior Anticoagulants: The patient has                            taken no previous anticoagulant or antiplatelet                            agents. ASA Grade Assessment: II - A patient with                            mild systemic  disease. After reviewing the risks                            and benefits, the patient was deemed in                            satisfactory condition to undergo the procedure.                           After obtaining informed consent, the scope was                            passed under direct vision. Throughout the                            procedure, the patient's blood pressure, pulse, and                            oxygen saturations were monitored continuously. The                            [id number]) scope was introduced through                            the mouth, and used to inject contrast into and                            used to locate the major papilla. The ERCP was                            accomplished without difficulty. The patient                            tolerated the procedure well. Scope In: Scope Out: Findings:      The major papilla was adjacent to a medium-sizeddiverticulum. deep       selective cannulation was obtained on initial attempt and no pancreatic       duct injection or wire advancement was done throughout the procedure and       we proceeded with theBiliary sphincterotomy was made with a Hydratome       sphincterotome using ERBE electrocautery in the customary fashion until       we had adequate biliary drainage and could get the fully bowed       sphincterotome easily in and out of the duct. There was no       post-sphincterotomy bleeding. To discover objects, the biliary tree was       swept with a 12 mm adjustable balloon starting at the upper third of the       main bile duct and both the left and righthepatic ducts. Three stones       were removed. 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                           - Choledocholithiasis was found. Complete removal                            was accomplished by biliary sphincterotomy and                            balloon extraction.                           - A biliary sphincterotomy was performed.                           - The biliary tree was swept. no pancreatic duct                             injection or wire advancement throughout the                            procedure Moderate Sedation:      moderate sedation-none Recommendation:           - Avoid aspirin and nonsteroidal anti-inflammatory                            medicines for 1 week.                           - Continue present medications. hopefully advance                            diet tomorrow and home soon and recheck liver tests                            as an outpatient on surgical follow-up just to make                            sure they are back to normal                           - Return to GI clinic PRN.                           - Telephone GI clinic if symptomatic PRN.                           - Observe patient in GI recovery unit for                            observation. Procedure Code(s):        --- Professional ---  [id number], Esophagogastroduodenoscopy, flexible,                            transoral; diagnostic, including collection of                            specimen(s) by brushing or washing, when performed                            (separate procedure) Diagnosis Code(s):        --- Professional ---                           K80.50, Calculus of bile duct without cholangitis                            or cholecystitis without obstruction CPT copyright 2016 [employer]. All rights reserved. The codes documented in this report are preliminary and upon coder review may  be revised to meet current compliance requirements. Vida RiggerMarc Tysin Salada, MD 08/01/2016 6:59:41 PM This report has been signed electronically. Number of Addenda: 0

## 2016-08-01 NOTE — Anesthesia Procedure Notes (Signed)
Procedure Name: Intubation Date/Time: 08/01/2016 6:19 PM Performed by: Brien MatesMAHONY, Birdie Beveridge D Pre-anesthesia Checklist: Patient identified, Emergency Drugs available, Suction available, Patient being monitored and Timeout performed Patient Re-evaluated:Patient Re-evaluated prior to inductionOxygen Delivery Method: Circle system utilized Preoxygenation: Pre-oxygenation with 100% oxygen Intubation Type: IV induction Ventilation: Mask ventilation without difficulty Laryngoscope Size: Miller and 2 Grade View: Grade I Tube type: Oral Tube size: 7.5 mm Number of attempts: 1 Airway Equipment and Method: Stylet Placement Confirmation: ETT inserted through vocal cords under direct vision,  positive ETCO2 and breath sounds checked- equal and bilateral Secured at: 22 cm Tube secured with: Tape Dental Injury: Teeth and Oropharynx as per pre-operative assessment

## 2016-08-01 NOTE — Progress Notes (Signed)
Patient ID: Lawrence Keith, male   DOB: March 13, 1951, 65 y.o.   MRN: [id number] [facility] Progress Note:   1 Day Post-Op  Subjective: Mental status is clear.   Objective: Vital signs in last 24 hours: Temp:  [97.7 F (36.5 C)-98.7 F (37.1 C)] 98.7 F (37.1 C) (08/20 0618) Pulse Rate:  [59-107] 100 (08/20 0618) Resp:  [12-25] 17 (08/20 0219) BP: (105-170)/(63-91) 112/64 (08/20 0618) SpO2:  [88 %-100 %] 92 % (08/20 0618)  Intake/Output from previous day: 08/19 0701 - 08/20 0700 In: 2948.3 [P.O.:270; I.V.:2628.3; IV Piggyback:50] Out: 760 [Urine:675; Blood:10] Intake/Output this shift: Total I/O In: 30 [P.O.:30] Out: -   Physical Exam: Work of breathing is normal.  Incisions are all dry  Lab Results:  Results for orders placed or performed during the hospital encounter of 07/31/16 (from the past 48 hour(s))  Urinalysis, Routine w reflex microscopic     Status: Abnormal   Collection Time: 07/31/16  4:09 AM  Result Value Ref Range   Color, Urine YELLOW YELLOW   APPearance CLEAR CLEAR   Specific Gravity, Urine 1.025 1.005 - 1.030   pH 5.0 5.0 - 8.0   Glucose, UA 250 (A) NEGATIVE mg/dL   Hgb urine dipstick NEGATIVE NEGATIVE   Bilirubin Urine NEGATIVE NEGATIVE   Ketones, ur 15 (A) NEGATIVE mg/dL   Protein, ur NEGATIVE NEGATIVE mg/dL   Nitrite NEGATIVE NEGATIVE   Leukocytes, UA NEGATIVE NEGATIVE    Comment: MICROSCOPIC NOT DONE ON URINES WITH NEGATIVE PROTEIN, BLOOD, LEUKOCYTES, NITRITE, OR GLUCOSE <1000 mg/dL.  Lipase, blood     Status: None   Collection Time: 07/31/16  4:12 AM  Result Value Ref Range   Lipase 17 11 - 51 U/L  Comprehensive metabolic panel     Status: Abnormal   Collection Time: 07/31/16  4:12 AM  Result Value Ref Range   Sodium 141 135 - 145 mmol/L   Potassium 3.6 3.5 - 5.1 mmol/L   Chloride 104 101 - 111 mmol/L   CO2 28 22 - 32 mmol/L   Glucose, Bld 208 (H) 65 - 99 mg/dL   BUN 19 6 - 20 mg/dL   Creatinine, Ser 1.25 (H) 0.61 - 1.24  mg/dL   Calcium 9.3 8.9 - 10.3 mg/dL   Total Protein 6.5 6.5 - 8.1 g/dL   Albumin 4.1 3.5 - 5.0 g/dL   AST 20 15 - 41 U/L   ALT 18 17 - 63 U/L   Alkaline Phosphatase 51 38 - 126 U/L   Total Bilirubin 0.6 0.3 - 1.2 mg/dL   GFR calc non Af Amer 60 (L) >60 mL/min   GFR calc Af Amer >60 >60 mL/min    Comment: (NOTE) The eGFR has been calculated using the CKD EPI equation. This calculation has not been validated in all clinical situations. eGFR's persistently <60 mL/min signify possible Chronic Kidney Disease.    Anion gap 9 5 - 15  CBC     Status: None   Collection Time: 07/31/16  4:12 AM  Result Value Ref Range   WBC 9.8 4.0 - 10.5 K/uL   RBC 4.51 4.22 - 5.81 MIL/uL   Hemoglobin 13.7 13.0 - 17.0 g/dL   HCT 41.8 39.0 - 52.0 %   MCV 92.7 78.0 - 100.0 fL   MCH 30.4 26.0 - 34.0 pg   MCHC 32.8 30.0 - 36.0 g/dL   RDW 12.3 11.5 - 15.5 %   Platelets 184 150 - 400 K/uL  POC CBG, ED  Status: Abnormal   Collection Time: 07/31/16  9:15 AM  Result Value Ref Range   Glucose-Capillary 236 (H) 65 - 99 mg/dL  Surgical pcr screen     Status: None   Collection Time: 07/31/16 10:35 AM  Result Value Ref Range   MRSA, PCR NEGATIVE NEGATIVE   Staphylococcus aureus NEGATIVE NEGATIVE    Comment:        The Xpert SA Assay (FDA approved for NASAL specimens in patients over 55 years of age), is one component of a comprehensive surveillance program.  Test performance has been validated by [facility] for patients greater than or equal to 17 year old. It is not intended to diagnose infection nor to guide or monitor treatment.   Glucose, capillary     Status: Abnormal   Collection Time: 07/31/16 12:26 PM  Result Value Ref Range   Glucose-Capillary 191 (H) 65 - 99 mg/dL  Glucose, capillary     Status: Abnormal   Collection Time: 07/31/16  4:41 PM  Result Value Ref Range   Glucose-Capillary 191 (H) 65 - 99 mg/dL  Glucose, capillary     Status: Abnormal   Collection Time: 07/31/16  6:51 PM   Result Value Ref Range   Glucose-Capillary 225 (H) 65 - 99 mg/dL  Glucose, capillary     Status: Abnormal   Collection Time: 07/31/16  8:59 PM  Result Value Ref Range   Glucose-Capillary 256 (H) 65 - 99 mg/dL  CBC     Status: Abnormal   Collection Time: 08/01/16  4:12 AM  Result Value Ref Range   WBC 9.4 4.0 - 10.5 K/uL   RBC 4.11 (L) 4.22 - 5.81 MIL/uL   Hemoglobin 12.5 (L) 13.0 - 17.0 g/dL   HCT 38.7 (L) 39.0 - 52.0 %   MCV 94.2 78.0 - 100.0 fL   MCH 30.4 26.0 - 34.0 pg   MCHC 32.3 30.0 - 36.0 g/dL   RDW 12.8 11.5 - 15.5 %   Platelets 142 (L) 150 - 400 K/uL  Comprehensive metabolic panel     Status: Abnormal   Collection Time: 08/01/16  4:12 AM  Result Value Ref Range   Sodium 138 135 - 145 mmol/L   Potassium 4.3 3.5 - 5.1 mmol/L   Chloride 99 (L) 101 - 111 mmol/L   CO2 29 22 - 32 mmol/L   Glucose, Bld 174 (H) 65 - 99 mg/dL   BUN 15 6 - 20 mg/dL   Creatinine, Ser 1.26 (H) 0.61 - 1.24 mg/dL   Calcium 8.3 (L) 8.9 - 10.3 mg/dL   Total Protein 5.5 (L) 6.5 - 8.1 g/dL   Albumin 3.2 (L) 3.5 - 5.0 g/dL   AST 218 (H) 15 - 41 U/L   ALT 204 (H) 17 - 63 U/L   Alkaline Phosphatase 55 38 - 126 U/L   Total Bilirubin 0.8 0.3 - 1.2 mg/dL   GFR calc non Af Amer 59 (L) >60 mL/min   GFR calc Af Amer >60 >60 mL/min    Comment: (NOTE) The eGFR has been calculated using the CKD EPI equation. This calculation has not been validated in all clinical situations. eGFR's persistently <60 mL/min signify possible Chronic Kidney Disease.    Anion gap 10 5 - 15  Glucose, capillary     Status: Abnormal   Collection Time: 08/01/16  8:03 AM  Result Value Ref Range   Glucose-Capillary 182 (H) 65 - 99 mg/dL    Radiology/Results: Dg Cholangiogram Operative  Result Date: 07/31/2016  CLINICAL DATA:  Intraoperative cholangiogram. Fluoro time 16 seconds. EXAM: INTRAOPERATIVE CHOLANGIOGRAM TECHNIQUE: Cholangiographic images from the C-arm fluoroscopic device were submitted for interpretation  post-operatively. Please see the procedural report for the amount of contrast and the fluoroscopy time utilized. COMPARISON:  Ultrasound, 07/31/2016 FINDINGS: Two small filling defects project in the distal duct adjacent to the ampulla suggesting retained ductal stones. There are no other filling defect. There is no duct dilation. No contrast extravasation. IMPRESSION: Findings suspicious for 2 small stones in the distal common bile duct. Electronically Signed   By: Lajean Manes M.D.   On: 07/31/2016 16:01   US Abdomen Complete  Result Date: 07/31/2016 CLINICAL DATA:  Abdominal pain for 4 hours EXAM: ABDOMEN ULTRASOUND COMPLETE COMPARISON:  None. FINDINGS: Gallbladder: There is cholelithiasis and gallbladder sludge. The largest stone measures 1.7 cm and is nonmobile, located at the gallbladder neck. Sonographic Percell Miller sign could not be assessed because the patient had an given pain medicine. Gallbladder wall thickness is normal, measuring 2.7 mm. No pericholecystic fluid. Common bile duct: Diameter: 4.6 mm, normal Liver: No focal lesion identified. Within normal limits in parenchymal echogenicity. IVC: No abnormality visualized. Pancreas: Visualized portion unremarkable. Spleen: Size and appearance within normal limits. Length measures 6 cm. Right Kidney: Length: 12.1 cm. Echogenicity within normal limits. No mass or hydronephrosis visualized. Left Kidney: Length: 11.6 cm. Echogenicity within normal limits. No mass or hydronephrosis visualized. Abdominal aorta: No aneurysm visualized. AP diameter measures 2.6 cm Other findings: None. IMPRESSION: 1. 1.7 cm calculus at the gallbladder neck. No pericholecystic fluid or gallbladder wall thickening. 2. Otherwise normal abdominal ultrasound. Electronically Signed   By: Ulyses Jarred M.D.   On: 07/31/2016 06:48    Anti-infectives: Anti-infectives    Start     Dose/Rate Route Frequency Ordered Stop   07/31/16 1730  cefTRIAXone (ROCEPHIN) 2 g in dextrose 5 % 50 mL  IVPB     2 g 100 mL/hr over 30 Minutes Intravenous Every 24 hours 07/31/16 1718     07/31/16 1245  cefTRIAXone (ROCEPHIN) 2 g in dextrose 5 % 50 mL IVPB  Status:  Discontinued     2 g 100 mL/hr over 30 Minutes Intravenous Every 24 hours 07/31/16 1230 07/31/16 1708      Assessment/Plan: Problem List: Patient Active Problem List   Diagnosis Date Noted  . Symptomatic cholelithiasis 07/31/2016  . Toe ulcer, right ([place]) 07/13/2016    IOC with two filling defects and elevated transaminases today.  Patient sees [facility] and I have called Dr. Watt Climes to discuss.  Daughter Neoma Laming is a new anesthesiologist at [facility] (3 weeks) 1 Day Post-Op    LOS: 0 days   Matt B. Hassell Done, MD, [facility] [phone] beeper [phone]  08/01/2016 8:53 AM

## 2016-08-01 NOTE — Progress Notes (Signed)
Received patient from PACU accompanied by daughters.  Patient AOx4, ambulatory, VS stable and pain 4/10 but patient refused pain medication at this time.  Provided patient with chicken broth, water and sprite zero.

## 2016-08-01 NOTE — Consult Note (Signed)
Reason for Consult: CBD stones Referring Physician: Braydon Kullman is an 65 y.o. male.  HPI: Patient seen and examined and case discussed with Dr. Hassell Done in multiple family members and his hospital computer chart reviewed and prior to his symptomatic gallstones which brought him to the hospital he did not have any upper tract symptoms and he has had uneventful screening colonoscopies by my partner Dr. Amedeo Plenty but no previous upper tract workup and no swallowing problems and currently he is feeling better than when he came to the hospital and he just has incisional pain which is different than his admission pain and no other complaints  Past Medical History:  Diagnosis Date  . Diabetes mellitus without complication ([place])     History reviewed. No pertinent surgical history.  History reviewed. No pertinent family history.  Social History:  reports that he has never smoked. He does not have any smokeless tobacco history on file. He reports that he does not drink alcohol or use drugs.  Allergies: No Known Allergies  Medications: I have reviewed the patient's current medications.  Results for orders placed or performed during the hospital encounter of 07/31/16 (from the past 48 hour(s))  Urinalysis, Routine w reflex microscopic     Status: Abnormal   Collection Time: 07/31/16  4:09 AM  Result Value Ref Range   Color, Urine YELLOW YELLOW   APPearance CLEAR CLEAR   Specific Gravity, Urine 1.025 1.005 - 1.030   pH 5.0 5.0 - 8.0   Glucose, UA 250 (A) NEGATIVE mg/dL   Hgb urine dipstick NEGATIVE NEGATIVE   Bilirubin Urine NEGATIVE NEGATIVE   Ketones, ur 15 (A) NEGATIVE mg/dL   Protein, ur NEGATIVE NEGATIVE mg/dL   Nitrite NEGATIVE NEGATIVE   Leukocytes, UA NEGATIVE NEGATIVE    Comment: MICROSCOPIC NOT DONE ON URINES WITH NEGATIVE PROTEIN, BLOOD, LEUKOCYTES, NITRITE, OR GLUCOSE <1000 mg/dL.  Lipase, blood     Status: None   Collection Time: 07/31/16  4:12 AM  Result Value  Ref Range   Lipase 17 11 - 51 U/L  Comprehensive metabolic panel     Status: Abnormal   Collection Time: 07/31/16  4:12 AM  Result Value Ref Range   Sodium 141 135 - 145 mmol/L   Potassium 3.6 3.5 - 5.1 mmol/L   Chloride 104 101 - 111 mmol/L   CO2 28 22 - 32 mmol/L   Glucose, Bld 208 (H) 65 - 99 mg/dL   BUN 19 6 - 20 mg/dL   Creatinine, Ser 1.25 (H) 0.61 - 1.24 mg/dL   Calcium 9.3 8.9 - 10.3 mg/dL   Total Protein 6.5 6.5 - 8.1 g/dL   Albumin 4.1 3.5 - 5.0 g/dL   AST 20 15 - 41 U/L   ALT 18 17 - 63 U/L   Alkaline Phosphatase 51 38 - 126 U/L   Total Bilirubin 0.6 0.3 - 1.2 mg/dL   GFR calc non Af Amer 60 (L) >60 mL/min   GFR calc Af Amer >60 >60 mL/min    Comment: (NOTE) The eGFR has been calculated using the CKD EPI equation. This calculation has not been validated in all clinical situations. eGFR's persistently <60 mL/min signify possible Chronic Kidney Disease.    Anion gap 9 5 - 15  CBC     Status: None   Collection Time: 07/31/16  4:12 AM  Result Value Ref Range   WBC 9.8 4.0 - 10.5 K/uL   RBC 4.51 4.22 - 5.81 MIL/uL   Hemoglobin  13.7 13.0 - 17.0 g/dL   HCT 38.5 06.8 - 05.2 %   MCV 92.7 78.0 - 100.0 fL   MCH 30.4 26.0 - 34.0 pg   MCHC 32.8 30.0 - 36.0 g/dL   RDW 45.0 16.1 - 88.6 %   Platelets 184 150 - 400 K/uL  POC CBG, ED     Status: Abnormal   Collection Time: 07/31/16  9:15 AM  Result Value Ref Range   Glucose-Capillary 236 (H) 65 - 99 mg/dL  Surgical pcr screen     Status: None   Collection Time: 07/31/16 10:35 AM  Result Value Ref Range   MRSA, PCR NEGATIVE NEGATIVE   Staphylococcus aureus NEGATIVE NEGATIVE    Comment:        The Xpert SA Assay (FDA approved for NASAL specimens in patients over 28 years of age), is one component of a comprehensive surveillance program.  Test performance has been validated by [facility] for patients greater than or equal to 79 year old. It is not intended to diagnose infection nor to guide or monitor treatment.    Glucose, capillary     Status: Abnormal   Collection Time: 07/31/16 12:26 PM  Result Value Ref Range   Glucose-Capillary 191 (H) 65 - 99 mg/dL  Glucose, capillary     Status: Abnormal   Collection Time: 07/31/16  4:41 PM  Result Value Ref Range   Glucose-Capillary 191 (H) 65 - 99 mg/dL  Glucose, capillary     Status: Abnormal   Collection Time: 07/31/16  6:51 PM  Result Value Ref Range   Glucose-Capillary 225 (H) 65 - 99 mg/dL  Glucose, capillary     Status: Abnormal   Collection Time: 07/31/16  8:59 PM  Result Value Ref Range   Glucose-Capillary 256 (H) 65 - 99 mg/dL  CBC     Status: Abnormal   Collection Time: 08/01/16  4:12 AM  Result Value Ref Range   WBC 9.4 4.0 - 10.5 K/uL   RBC 4.11 (L) 4.22 - 5.81 MIL/uL   Hemoglobin 12.5 (L) 13.0 - 17.0 g/dL   HCT 78.3 (L) 78.0 - 35.1 %   MCV 94.2 78.0 - 100.0 fL   MCH 30.4 26.0 - 34.0 pg   MCHC 32.3 30.0 - 36.0 g/dL   RDW 67.9 94.8 - 70.9 %   Platelets 142 (L) 150 - 400 K/uL  Comprehensive metabolic panel     Status: Abnormal   Collection Time: 08/01/16  4:12 AM  Result Value Ref Range   Sodium 138 135 - 145 mmol/L   Potassium 4.3 3.5 - 5.1 mmol/L   Chloride 99 (L) 101 - 111 mmol/L   CO2 29 22 - 32 mmol/L   Glucose, Bld 174 (H) 65 - 99 mg/dL   BUN 15 6 - 20 mg/dL   Creatinine, Ser 7.48 (H) 0.61 - 1.24 mg/dL   Calcium 8.3 (L) 8.9 - 10.3 mg/dL   Total Protein 5.5 (L) 6.5 - 8.1 g/dL   Albumin 3.2 (L) 3.5 - 5.0 g/dL   AST 328 (H) 15 - 41 U/L   ALT 204 (H) 17 - 63 U/L   Alkaline Phosphatase 55 38 - 126 U/L   Total Bilirubin 0.8 0.3 - 1.2 mg/dL   GFR calc non Af Amer 59 (L) >60 mL/min   GFR calc Af Amer >60 >60 mL/min    Comment: (NOTE) The eGFR has been calculated using the CKD EPI equation. This calculation has not been validated in all clinical  situations. eGFR's persistently <60 mL/min signify possible Chronic Kidney Disease.    Anion gap 10 5 - 15  Glucose, capillary     Status: Abnormal   Collection Time: 08/01/16   8:03 AM  Result Value Ref Range   Glucose-Capillary 182 (H) 65 - 99 mg/dL    Dg Cholangiogram Operative  Result Date: 07/31/2016 CLINICAL DATA:  Intraoperative cholangiogram. Fluoro time 16 seconds. EXAM: INTRAOPERATIVE CHOLANGIOGRAM TECHNIQUE: Cholangiographic images from the C-arm fluoroscopic device were submitted for interpretation post-operatively. Please see the procedural report for the amount of contrast and the fluoroscopy time utilized. COMPARISON:  Ultrasound, 07/31/2016 FINDINGS: Two small filling defects project in the distal duct adjacent to the ampulla suggesting retained ductal stones. There are no other filling defect. There is no duct dilation. No contrast extravasation. IMPRESSION: Findings suspicious for 2 small stones in the distal common bile duct. Electronically Signed   By: Lajean Manes M.D.   On: 07/31/2016 16:01   US Abdomen Complete  Result Date: 07/31/2016 CLINICAL DATA:  Abdominal pain for 4 hours EXAM: ABDOMEN ULTRASOUND COMPLETE COMPARISON:  None. FINDINGS: Gallbladder: There is cholelithiasis and gallbladder sludge. The largest stone measures 1.7 cm and is nonmobile, located at the gallbladder neck. Sonographic Percell Miller sign could not be assessed because the patient had an given pain medicine. Gallbladder wall thickness is normal, measuring 2.7 mm. No pericholecystic fluid. Common bile duct: Diameter: 4.6 mm, normal Liver: No focal lesion identified. Within normal limits in parenchymal echogenicity. IVC: No abnormality visualized. Pancreas: Visualized portion unremarkable. Spleen: Size and appearance within normal limits. Length measures 6 cm. Right Kidney: Length: 12.1 cm. Echogenicity within normal limits. No mass or hydronephrosis visualized. Left Kidney: Length: 11.6 cm. Echogenicity within normal limits. No mass or hydronephrosis visualized. Abdominal aorta: No aneurysm visualized. AP diameter measures 2.6 cm Other findings: None. IMPRESSION: 1. 1.7 cm calculus at the  gallbladder neck. No pericholecystic fluid or gallbladder wall thickening. 2. Otherwise normal abdominal ultrasound. Electronically Signed   By: Ulyses Jarred M.D.   On: 07/31/2016 06:48    ROS negative except above Blood pressure 112/64, pulse 100, temperature 98.7 F (37.1 C), temperature source Oral, resp. rate 17, height '6\' 3"'$  (1.905 m), weight 91.6 kg (202 lb), SpO2 92 %. Physical Exam vital signs stable afebrile no acute distress lungs are clear regular rate and rhythm abdomen is soft rare bowel sounds nontender except for by his incisions IOC reviewed ultrasound reviewed liver tests increased white count okay  Assessment/Plan: Positive Intra-Op cholangiogram Plan: The risks benefits methods and success rate of ERCP and stone removal was discussed with the patient and his family as well as the risks of leaving stones in and hopefully we can proceed later today with further workup and plans pending those findings  Iuka E 08/01/2016, 9:34 AM

## 2016-08-02 ENCOUNTER — Encounter (HOSPITAL_COMMUNITY): Payer: Self-pay | Admitting: Surgery

## 2016-08-02 ENCOUNTER — Ambulatory Visit: Payer: Federal, State, Local not specified - PPO | Admitting: Podiatry

## 2016-08-02 LAB — GLUCOSE, CAPILLARY
GLUCOSE-CAPILLARY: 166 mg/dL — AB (ref 65–99)
Glucose-Capillary: 224 mg/dL — ABNORMAL HIGH (ref 65–99)
Glucose-Capillary: 263 mg/dL — ABNORMAL HIGH (ref 65–99)
Glucose-Capillary: 187 mg/dL — ABNORMAL HIGH (ref 65–99)

## 2016-08-02 LAB — CBC WITH DIFFERENTIAL/PLATELET
BASOS ABS: 0 10*3/uL (ref 0.0–0.1)
BASOS PCT: 0 %
Eosinophils Absolute: 0 10*3/uL (ref 0.0–0.7)
Eosinophils Relative: 1 %
HEMATOCRIT: 39.7 % (ref 39.0–52.0)
HEMOGLOBIN: 12.8 g/dL — AB (ref 13.0–17.0)
Lymphocytes Relative: 7 %
Lymphs Abs: 0.5 10*3/uL — ABNORMAL LOW (ref 0.7–4.0)
MCH: 30.4 pg (ref 26.0–34.0)
MCHC: 32.2 g/dL (ref 30.0–36.0)
MCV: 94.3 fL (ref 78.0–100.0)
Monocytes Absolute: 0.6 10*3/uL (ref 0.1–1.0)
Monocytes Relative: 8 %
NEUTROS ABS: 6.1 10*3/uL (ref 1.7–7.7)
NEUTROS PCT: 84 %
Platelets: 126 10*3/uL — ABNORMAL LOW (ref 150–400)
RBC: 4.21 MIL/uL — ABNORMAL LOW (ref 4.22–5.81)
RDW: 12.7 % (ref 11.5–15.5)
WBC: 7.1 10*3/uL (ref 4.0–10.5)

## 2016-08-02 LAB — COMPREHENSIVE METABOLIC PANEL
ALBUMIN: 3.1 g/dL — AB (ref 3.5–5.0)
ALK PHOS: 81 U/L (ref 38–126)
ALT: 186 U/L — ABNORMAL HIGH (ref 17–63)
AST: 125 U/L — AB (ref 15–41)
Anion gap: 7 (ref 5–15)
BILIRUBIN TOTAL: 1.1 mg/dL (ref 0.3–1.2)
BUN: 7 mg/dL (ref 6–20)
CO2: 29 mmol/L (ref 22–32)
Calcium: 8.1 mg/dL — ABNORMAL LOW (ref 8.9–10.3)
Chloride: 100 mmol/L — ABNORMAL LOW (ref 101–111)
Creatinine, Ser: 1.09 mg/dL (ref 0.61–1.24)
GFR calc Af Amer: >60 mL/min (ref >60)
GFR calc non Af Amer: >60 mL/min (ref >60)
GLUCOSE: 225 mg/dL — AB (ref 65–99)
POTASSIUM: 3.9 mmol/L (ref 3.5–5.1)
Sodium: 136 mmol/L (ref 135–145)
TOTAL PROTEIN: 5.6 g/dL — AB (ref 6.5–8.1)

## 2016-08-02 MED ORDER — METOCLOPRAMIDE HCL 5 MG/ML IJ SOLN
10.0000 mg | Freq: Four times a day (QID) | INTRAMUSCULAR | Status: DC
  Administered 2016-08-02 – 2016-08-03 (×4): 10 mg via INTRAVENOUS
  Filled 2016-08-02 (×4): qty 2

## 2016-08-02 NOTE — Progress Notes (Addendum)
Inpatient Diabetes Program Recommendations  AACE/ADA: New Consensus Statement on Inpatient Glycemic Control (2015)  Target Ranges:  Prepandial:   less than 140 mg/dL      Peak postprandial:   less than 180 mg/dL (1-2 hours)      Critically ill patients:  140 - 180 mg/dL   Lab Results  Component Value Date   GLUCAP 263 (H) 08/02/2016    Review of Glycemic Control  Diabetes history: DM 2 Outpatient Diabetes medications: Amaryl 2 mg and Metformin XR 1000 mg daily Current orders for Inpatient glycemic control: Home meds and sensitive correction tidwc.  Inpatient Diabetes Program Recommendations:     Results for Lawrence Keith, Lawrence Keith (MRN [id number]) as of 08/02/2016 15:02  Ref. Range 08/01/2016 22:09 08/02/2016 07:19 08/02/2016 11:24  Glucose-Capillary Latest Ref Range: 65 - 99 mg/dL 782209 (H)  2 units correction 224 (H)  3 units correction  263 (H)  5 units correction   Glucose running in 200's today. May want to add low dose basal lantus if fastings continue elevated, please consider using low dose basal lantus at 10 units while here.  Thank you Lenor CoffinAnn Jonee Lamore, RN, MSN, CDE  Diabetes Inpatient Program Office: [phone] Pager: [phone] 8:00 am to 5:00 pm

## 2016-08-02 NOTE — Progress Notes (Signed)
[facility] PROGRESS NOTE Subjective patient had ERCP, sphincterotomy and stone removal yesterday by Dr. Ewing SchleinMagod. He is still having some nausea and pain this morning but says that this is no worse than it was when he came in. He has not vomited and did tolerate some coffee. No bowel movement but did have some flatus.  Objective: Vital signs in last 24 hours: Temp:  [98 F (36.7 C)-99.6 F (37.6 C)] 98.6 F (37 C) (08/21 0618) Pulse Rate:  [83-102] 83 (08/21 0618) Resp:  [14-18] 17 (08/21 0618) BP: (131-144)/(73-83) 136/79 (08/21 0618) SpO2:  [92 %-100 %] 92 % (08/21 0652) Last BM Date: 07/31/16  Intake/Output from previous day: 08/20 0701 - 08/21 0700 In: 3278.8 [P.O.:610; I.V.:2668.8] Out: 1700 [Urine:1700] Intake/Output this shift: No intake/output data recorded.  PE: General-- alert oriented watching television  Lungs-- clear Abdomen-- mild right upper quadrant tenderness. Few bowel sounds present  Lab Results:  Recent Labs  07/31/16 0412 08/01/16 0412 08/02/16 0509  WBC 9.8 9.4 7.1  HGB 13.7 12.5* 12.8*  HCT 41.8 38.7* 39.7  PLT 184 142* 126*   BMET  Recent Labs  07/31/16 0412 08/01/16 0412 08/02/16 0509  NA 141 138 136  K 3.6 4.3 3.9  CL 104 99* 100*  CO2 28 29 29   CREATININE 1.25* 1.26* 1.09   LFT  Recent Labs  07/31/16 0412 08/01/16 0412 08/02/16 0509  PROT 6.5 5.5* 5.6*  AST 20 218* 125*  ALT 18 204* 186*  ALKPHOS 51 55 81  BILITOT 0.6 0.8 1.1   PT/INR No results for input(s): LABPROT, INR in the last 72 hours. PANCREAS  Recent Labs  07/31/16 0412  LIPASE 17         Studies/Results: Dg Cholangiogram Operative  Result Date: 07/31/2016 CLINICAL DATA:  Intraoperative cholangiogram. Fluoro time 16 seconds. EXAM: INTRAOPERATIVE CHOLANGIOGRAM TECHNIQUE: Cholangiographic images from the C-arm fluoroscopic device were submitted for interpretation post-operatively. Please see the procedural report for the amount of contrast  and the fluoroscopy time utilized. COMPARISON:  Ultrasound, 07/31/2016 FINDINGS: Two small filling defects project in the distal duct adjacent to the ampulla suggesting retained ductal stones. There are no other filling defect. There is no duct dilation. No contrast extravasation. IMPRESSION: Findings suspicious for 2 small stones in the distal common bile duct. Electronically Signed   By: Amie Portlandavid  Ormond M.D.   On: 07/31/2016 16:01   Dg Ercp Biliary & Pancreatic Ducts  Result Date: 08/01/2016 CLINICAL DATA:  ERCP and sphincterotomy EXAM: ERCP TECHNIQUE: Multiple spot images obtained with the fluoroscopic device and submitted for interpretation post-procedure. COMPARISON:  Intraoperative cholangiogram from yesterday FINDINGS: Two images from ERCP shows cholangiogram with 2 calculi in the distal common bile duct. Subsequent image shows balloon sweep in progress. Mild biliary dilatation. These images were submitted for radiologic interpretation only. Please see the procedural report for the amount of contrast and the fluoroscopy time utilized. IMPRESSION: ERCP showing choledocholithiasis and balloon sweep. Electronically Signed   By: Marnee SpringJonathon  Watts M.D.   On: 08/01/2016 22:15    Medications: I have reviewed the patient's current medications.  Assessment/Plan: 1. Choledocholithiasis. Status post ERCP yesterday. He still having some discomfort has had laparoscopic cholecystectomy and ERCP the past few days. His pain is not any worse and he may need just a little more time to get over this. Would advance.slowly and continue to follow.   Telia Amundson JR,Yamel Bale L 08/02/2016, 8:09 AM  This note was created using voice recognition software. Minor errors may Have occurred unintentionally.  Pager: [phone] If no answer or after hours call [phone]

## 2016-08-02 NOTE — Progress Notes (Signed)
Patient ID: Lawrence ShapeRichard E Mcraney, male   DOB: 07/06/1951, 65 y.o.   MRN: [id number]  [facility] Progress Note  1 Day Post-Op  Subjective: Reports nausea and 1 episode of vomiting this a.m. He has not yet tried to eat breakfast but did drink coffee a couple hours ago. No BM but has had flatus.  Objective: Vital signs in last 24 hours: Temp:  [98 F (36.7 C)-99.6 F (37.6 C)] 98.6 F (37 C) (08/21 0618) Pulse Rate:  [83-102] 83 (08/21 0618) Resp:  [14-18] 17 (08/21 0618) BP: (131-144)/(73-83) 136/79 (08/21 0618) SpO2:  [92 %-100 %] 92 % (08/21 0652) Last BM Date: 07/31/16  Intake/Output from previous day: 08/20 0701 - 08/21 0700 In: 3278.8 [P.O.:610; I.V.:2668.8] Out: 1700 [Urine:1700] Intake/Output this shift: Total I/O In: 425 [I.V.:375; IV Piggyback:50] Out: -   PE: Gen:  Alert, dry heaving Card:  RRR Pulm:  CTAB Abd: Soft, NT/ND, +BS, lap incision sites covered with dry dressings  Lab Results:   Recent Labs  08/01/16 0412 08/02/16 0509  WBC 9.4 7.1  HGB 12.5* 12.8*  HCT 38.7* 39.7  PLT 142* 126*   BMET  Recent Labs  08/01/16 0412 08/02/16 0509  NA 138 136  K 4.3 3.9  CL 99* 100*  CO2 29 29  GLUCOSE 174* 225*  BUN 15 7  CREATININE 1.26* 1.09  CALCIUM 8.3* 8.1*   PT/INR No results for input(s): LABPROT, INR in the last 72 hours. CMP     Component Value Date/Time   NA 136 08/02/2016 0509   K 3.9 08/02/2016 0509   CL 100 (L) 08/02/2016 0509   CO2 29 08/02/2016 0509   GLUCOSE 225 (H) 08/02/2016 0509   BUN 7 08/02/2016 0509   CREATININE 1.09 08/02/2016 0509   CALCIUM 8.1 (L) 08/02/2016 0509   PROT 5.6 (L) 08/02/2016 0509   ALBUMIN 3.1 (L) 08/02/2016 0509   AST 125 (H) 08/02/2016 0509   ALT 186 (H) 08/02/2016 0509   ALKPHOS 81 08/02/2016 0509   BILITOT 1.1 08/02/2016 0509   GFRNONAA >60 08/02/2016 0509   GFRAA >60 08/02/2016 0509   Lipase     Component Value Date/Time   LIPASE 17 07/31/2016 0412        Studies/Results: Dg Cholangiogram Operative  Result Date: 07/31/2016 CLINICAL DATA:  Intraoperative cholangiogram. Fluoro time 16 seconds. EXAM: INTRAOPERATIVE CHOLANGIOGRAM TECHNIQUE: Cholangiographic images from the C-arm fluoroscopic device were submitted for interpretation post-operatively. Please see the procedural report for the amount of contrast and the fluoroscopy time utilized. COMPARISON:  Ultrasound, 07/31/2016 FINDINGS: Two small filling defects project in the distal duct adjacent to the ampulla suggesting retained ductal stones. There are no other filling defect. There is no duct dilation. No contrast extravasation. IMPRESSION: Findings suspicious for 2 small stones in the distal common bile duct. Electronically Signed   By: Amie Portlandavid  Ormond M.D.   On: 07/31/2016 16:01   Dg Ercp Biliary & Pancreatic Ducts  Result Date: 08/01/2016 CLINICAL DATA:  ERCP and sphincterotomy EXAM: ERCP TECHNIQUE: Multiple spot images obtained with the fluoroscopic device and submitted for interpretation post-procedure. COMPARISON:  Intraoperative cholangiogram from yesterday FINDINGS: Two images from ERCP shows cholangiogram with 2 calculi in the distal common bile duct. Subsequent image shows balloon sweep in progress. Mild biliary dilatation. These images were submitted for radiologic interpretation only. Please see the procedural report for the amount of contrast and the fluoroscopy time utilized. IMPRESSION: ERCP showing choledocholithiasis and balloon sweep. Electronically Signed   By: Marja KaysJonathon  Watts M.D.   On: 08/01/2016 22:15    Anti-infectives: Anti-infectives    Start     Dose/Rate Route Frequency Ordered Stop   07/31/16 1730  cefTRIAXone (ROCEPHIN) 2 g in dextrose 5 % 50 mL IVPB     2 g 100 mL/hr over 30 Minutes Intravenous Every 24 hours 07/31/16 1718     07/31/16 1245  cefTRIAXone (ROCEPHIN) 2 g in dextrose 5 % 50 mL IVPB  Status:  Discontinued     2 g 100 mL/hr over 30 Minutes  Intravenous Every 24 hours 07/31/16 1230 07/31/16 1708       Assessment/Plan S/p laparoscopic cholecystectomy with IOC 07/31/16 -  ERCP, sphincterotomy and stone removal 08/01/16 by Dr. Ewing SchleinMagod -  - active n/v not responding to phenergan and zofran, add reglan FEN - carb modified, IVF VTE - SCD's Dispo - n/v   LOS: 1 day    Edson SnowballBROOKE A MILLER , [facility] 08/02/2016, 8:58 AM Pager: [phone] Consults: [phone] Mon-Fri 7:00 am-4:30 pm Sat-Sun 7:00 am-11:30 am

## 2016-08-03 LAB — CBC
HEMATOCRIT: 37.7 % — AB (ref 39.0–52.0)
Hemoglobin: 12.4 g/dL — ABNORMAL LOW (ref 13.0–17.0)
MCH: 30.6 pg (ref 26.0–34.0)
MCHC: 32.9 g/dL (ref 30.0–36.0)
MCV: 93.1 fL (ref 78.0–100.0)
PLATELETS: 139 10*3/uL — AB (ref 150–400)
RBC: 4.05 MIL/uL — ABNORMAL LOW (ref 4.22–5.81)
RDW: 12.4 % (ref 11.5–15.5)
WBC: 6.7 10*3/uL (ref 4.0–10.5)

## 2016-08-03 LAB — GLUCOSE, CAPILLARY: Glucose-Capillary: 207 mg/dL — ABNORMAL HIGH (ref 65–99)

## 2016-08-03 MED ORDER — ONDANSETRON 4 MG PO TBDP: 4.0000 mg | ORAL_TABLET | Freq: Four times a day (QID) | ORAL | 0 refills | Status: DC | PRN

## 2016-08-03 MED ORDER — OXYCODONE-ACETAMINOPHEN 5-325 MG PO TABS: 1.0000 | ORAL_TABLET | ORAL | 0 refills | Status: DC | PRN

## 2016-08-03 NOTE — Discharge Instructions (Signed)
Your appointment is 08/18/16 at 10:45, please arrive at least 30 min before your appointment to complete your check in paperwork.  If you are unable to arrive 30 min prior to your appointment time we may have to cancel or reschedule you.  LAPAROSCOPIC SURGERY: POST OP INSTRUCTIONS  1. DIET: Follow a light bland diet the first 24 hours after arrival home, such as soup, liquids, crackers, etc. Be sure to include lots of fluids daily. Avoid fast food or heavy meals as your are more likely to get nauseated. Eat a low fat the next few days after surgery.  2. Take your usually prescribed home medications unless otherwise directed. 3. PAIN CONTROL:  1. Pain is best controlled by a usual combination of three different methods TOGETHER:  1. Ice/Heat 2. Over the counter pain medication 3. Prescription pain medication 2. Most patients will experience some swelling and bruising around the incisions. Ice packs or heating pads (30-60 minutes up to 6 times a day) will help. Use ice for the first few days to help decrease swelling and bruising, then switch to heat to help relax tight/sore spots and speed recovery. Some people prefer to use ice alone, heat alone, alternating between ice & heat. Experiment to what works for you. Swelling and bruising can take several weeks to resolve.  3. It is helpful to take an over-the-counter pain medication regularly for the first few weeks. Choose one of the following that works best for you:  1. Naproxen (Aleve, etc) Two 220mg  tabs twice a day 2. Ibuprofen (Advil, etc) Three 200mg  tabs four times a day (every meal & bedtime) 3. Acetaminophen (Tylenol, etc) 500-650mg  four times a day (every meal & bedtime) 4. A prescription for pain medication (such as oxycodone, hydrocodone, etc) should be given to you upon discharge. Take your pain medication as prescribed.  1. If you are having problems/concerns with the prescription medicine (does not control pain, nausea, vomiting, rash,  itching, etc), please call us [phone] to see if we need to switch you to a different pain medicine that will work better for you and/or control your side effect better. 2. If you need a refill on your pain medication, please contact your pharmacy. They will contact our office to request authorization. Prescriptions will not be filled after 5 pm or on week-ends. 4. Avoid getting constipated. Between the surgery and the pain medications, it is common to experience some constipation. Increasing fluid intake and taking a fiber supplement (such as Metamucil, Citrucel, FiberCon, MiraLax, etc) 1-2 times a day regularly will usually help prevent this problem from occurring. A mild laxative (prune juice, Milk of Magnesia, MiraLax, etc) should be taken according to package directions if there are no bowel movements after 48 hours.  5. Watch out for diarrhea. If you have many loose bowel movements, simplify your diet to bland foods & liquids for a few days. Stop any stool softeners and decrease your fiber supplement. Switching to mild anti-diarrheal medications (Kayopectate, Pepto Bismol) can help. If this worsens or does not improve, please call us. 6. Wash / shower every day. You may shower over the dressings as they are waterproof. Continue to shower over incision(s) after the dressing is off. 7. Remove your waterproof bandages 5 days after surgery. You may leave the incision open to air. You may replace a dressing/Band-Aid to cover the incision for comfort if you wish.  8. ACTIVITIES as tolerated:  1. You may resume regular (light) daily activities beginning the next day--such  as daily self-care, walking, climbing stairs--gradually increasing activities as tolerated. If you can walk 30 minutes without difficulty, it is safe to try more intense activity such as jogging, treadmill, bicycling, low-impact aerobics, swimming, etc. 2. Save the most intensive and strenuous activity for last such as sit-ups, heavy  lifting, contact sports, etc Refrain from any heavy lifting or straining until you are off narcotics for pain control.  3. DO NOT PUSH THROUGH PAIN. Let pain be your guide: If it hurts to do something, don't do it. Pain is your body warning you to avoid that activity for another week until the pain goes down. 4. You may drive when you are no longer taking prescription pain medication, you can comfortably wear a seatbelt, and you can safely maneuver your car and apply brakes. 5. You may have sexual intercourse when it is comfortable.  9. FOLLOW UP in our office  1. Please call [facility] at [phone] to set up an appointment to see your surgeon in the office for a follow-up appointment approximately 2-3 weeks after your surgery. 2. Make sure that you call for this appointment the day you arrive home to insure a convenient appointment time.      10. IF YOU HAVE DISABILITY OR FAMILY LEAVE FORMS, BRING THEM TO THE               OFFICE FOR PROCESSING.   WHEN TO CALL US [phone]:  1. Poor pain control 2. Reactions / problems with new medications (rash/itching, nausea, etc)  3. Fever over 101.5 F (38.5 C) 4. Inability to urinate 5. Nausea and/or vomiting 6. Worsening swelling or bruising 7. Continued bleeding from incision. 8. Increased pain, redness, or drainage from the incision  The clinic staff is available to answer your questions during regular business hours (8:30am-5pm). Please dont hesitate to call and ask to speak to one of our nurses for clinical concerns.  If you have a medical emergency, go to the nearest emergency room or call 911.  A surgeon from [facility] is always on call at the [facility]   [facility], [place]  [address], [address], [place], [place] [address] ?  MAIN: [phone] ? TOLL FREE: [phone] ?  FAX [phone]  [email or web address]

## 2016-08-03 NOTE — Progress Notes (Signed)
Patient ID: Lawrence Keith, male   DOB: 10/18/1951, 65 y.o.   MRN: [id number]  [facility]  2 Days Post-Op  Subjective: Feeling very well this morning. States that he is ready to go home. Denies n/v. Tolerating bland diet well. Had BM this morning.  Objective: Vital signs in last 24 hours: Temp:  [98.3 F (36.8 C)-98.6 F (37 C)] 98.3 F (36.8 C) (08/21 2004) Pulse Rate:  [83-89] 89 (08/21 2004) Resp:  [17-19] 19 (08/21 2004) BP: (152-154)/(67-85) 152/67 (08/21 2004) SpO2:  [95 %-98 %] 98 % (08/21 2004) Last BM Date: 08/02/16  Intake/Output from previous day: 08/21 0701 - 08/22 0700 In: 3775 [P.O.:600; I.V.:3125; IV Piggyback:50] Out: 1650 [Urine:1650] Intake/Output this shift: No intake/output data recorded.  PE: Gen:  Alert, NAD, pleasant Card:  RRR Pulm:  CTAB Abd: Soft, NT/ND, +BS, lap incision sites covered with dry dressings  Lab Results:   Recent Labs  08/02/16 0509 08/03/16 0458  WBC 7.1 6.7  HGB 12.8* 12.4*  HCT 39.7 37.7*  PLT 126* 139*   BMET  Recent Labs  08/01/16 0412 08/02/16 0509  NA 138 136  K 4.3 3.9  CL 99* 100*  CO2 29 29  GLUCOSE 174* 225*  BUN 15 7  CREATININE 1.26* 1.09  CALCIUM 8.3* 8.1*   PT/INR No results for input(s): LABPROT, INR in the last 72 hours. CMP     Component Value Date/Time   NA 136 08/02/2016 0509   K 3.9 08/02/2016 0509   CL 100 (L) 08/02/2016 0509   CO2 29 08/02/2016 0509   GLUCOSE 225 (H) 08/02/2016 0509   BUN 7 08/02/2016 0509   CREATININE 1.09 08/02/2016 0509   CALCIUM 8.1 (L) 08/02/2016 0509   PROT 5.6 (L) 08/02/2016 0509   ALBUMIN 3.1 (L) 08/02/2016 0509   AST 125 (H) 08/02/2016 0509   ALT 186 (H) 08/02/2016 0509   ALKPHOS 81 08/02/2016 0509   BILITOT 1.1 08/02/2016 0509   GFRNONAA >60 08/02/2016 0509   GFRAA >60 08/02/2016 0509   Lipase     Component Value Date/Time   LIPASE 17 07/31/2016 0412       Studies/Results: Dg Ercp Biliary & Pancreatic  Ducts  Result Date: 08/01/2016 CLINICAL DATA:  ERCP and sphincterotomy EXAM: ERCP TECHNIQUE: Multiple spot images obtained with the fluoroscopic device and submitted for interpretation post-procedure. COMPARISON:  Intraoperative cholangiogram from yesterday FINDINGS: Two images from ERCP shows cholangiogram with 2 calculi in the distal common bile duct. Subsequent image shows balloon sweep in progress. Mild biliary dilatation. These images were submitted for radiologic interpretation only. Please see the procedural report for the amount of contrast and the fluoroscopy time utilized. IMPRESSION: ERCP showing choledocholithiasis and balloon sweep. Electronically Signed   By: Marnee SpringJonathon  Watts M.D.   On: 08/01/2016 22:15    Anti-infectives: Anti-infectives    Start     Dose/Rate Route Frequency Ordered Stop   07/31/16 1730  cefTRIAXone (ROCEPHIN) 2 g in dextrose 5 % 50 mL IVPB  Status:  Discontinued     2 g 100 mL/hr over 30 Minutes Intravenous Every 24 hours 07/31/16 1718 08/02/16 1407   07/31/16 1245  cefTRIAXone (ROCEPHIN) 2 g in dextrose 5 % 50 mL IVPB  Status:  Discontinued     2 g 100 mL/hr over 30 Minutes Intravenous Every 24 hours 07/31/16 1230 07/31/16 1708       Assessment/Plan S/p laparoscopic cholecystectomy with IOC 07/31/16 -  ERCP, sphincterotomy and stone removal 08/01/16  by Dr. Ewing SchleinMagod -  - feeling well, no longer having n/vb - AST/ALT trending down FEN - carb modified VTE - SCD's Plan - ready for discharge   LOS: 2 days    Edson SnowballBROOKE A MILLER , [facility] 08/03/2016, 7:53 AM Pager: [phone] Consults: [phone] Mon-Fri 7:00 am-4:30 pm Sat-Sun 7:00 am-11:30 am

## 2016-08-03 NOTE — Discharge Summary (Signed)
[facility] Discharge Summary   Patient ID: Maryanna ShapeRichard E Abdo MRN: [id number] DOB/AGE: 63/07/1951 63 y.o.  Admit date: 07/31/2016 Discharge date: 08/03/2016  Admitting Diagnosis: Abdominal pain  Discharge Diagnosis Patient Active Problem List   Diagnosis Date Noted  . Symptomatic cholelithiasis 07/31/2016  . Toe ulcer, right (HCC) 07/13/2016    Consultants Dr. Vida RiggerMarc Magod, GI Imaging: Dg Ercp Biliary & Pancreatic Ducts  Result Date: 08/01/2016 CLINICAL DATA:  ERCP and sphincterotomy EXAM: ERCP TECHNIQUE: Multiple spot images obtained with the fluoroscopic device and submitted for interpretation post-procedure. COMPARISON:  Intraoperative cholangiogram from yesterday FINDINGS: Two images from ERCP shows cholangiogram with 2 calculi in the distal common bile duct. Subsequent image shows balloon sweep in progress. Mild biliary dilatation. These images were submitted for radiologic interpretation only. Please see the procedural report for the amount of contrast and the fluoroscopy time utilized. IMPRESSION: ERCP showing choledocholithiasis and balloon sweep. Electronically Signed   By: Marnee SpringJonathon  Watts M.D.   On: 08/01/2016 22:15    Procedures Dr. Corliss Skainssuei (07/31/16) - Laparoscopic Cholecystectomy with IOC Dr. Ewing SchleinMagod (08/01/16) - ERCP  Hospital Course:  Thomasene RippleRichard Hy is a 65yo male [facility] with acute onset abdominal pain, nausea, and vomiting.  Workup showed cholecystitis with cholelithiasis.  Patient was admitted and underwent procedure listed above.  IOC showed two filling defects and elevated transaminases, therefore GI was consulted and performed ERCP.  Tolerated procedure well and was transferred to the floor.  On POD2 he did have some issues with nausea/vomiting, but by POD3 his symptoms improved. He was tolerating diet, ambulating well, pain well controlled, vital signs stable, incisions c/d/i and felt stable for discharge home.  Patient will follow  up in our office in 2 weeks and knows to call with questions or concerns.   Physical Exam: Gen: Alert, NAD, pleasant Card: RRR Pulm: CTAB Abd: Soft, NT/ND, +BS, lap incision sites covered with dry dressings     Medication List    TAKE these medications   ALPRAZolam 0.5 MG tablet Commonly known as:  XANAX at bedtime as needed.   aspirin 81 MG chewable tablet Chew by mouth.   fexofenadine 180 MG tablet Commonly known as:  ALLEGRA Take 180 mg by mouth daily.   glimepiride 2 MG tablet Commonly known as:  AMARYL Take 2 mg by mouth daily with breakfast.   losartan 25 MG tablet Commonly known as:  COZAAR Take 25 mg by mouth every morning.   metFORMIN 1000 MG tablet Commonly known as:  GLUCOPHAGE Take 1,000 mg by mouth 2 (two) times daily with a meal.   multivitamin with minerals tablet Take 1 tablet by mouth daily.   mupirocin ointment 2 % Commonly known as:  BACTROBAN Apply 1 application topically 2 (two) times daily. What changed:  when to take this  reasons to take this   ondansetron 4 MG disintegrating tablet Commonly known as:  ZOFRAN-ODT Take 1 tablet (4 mg total) by mouth every 6 (six) hours as needed for nausea.   oxyCODONE-acetaminophen 5-325 MG tablet Commonly known as:  ROXICET Take 1 tablet by mouth every 4 (four) hours as needed for severe pain.        Follow-up Information    [facility] [place] .   Specialty:  General Surgery Why:  08/18/16 at 10:45 with Josie SaundersAndy Contact information: [address] [place] [id number] [phone]          Signed: Edson SnowballBROOKE A MILLER, [facility] 08/03/2016, 3:51 PM Pager: [phone] Consults: [phone] Mon-Fri 7:00 am-4:30 pm Sat-Sun 7:00 am-11:30 am

## 2016-08-20 ENCOUNTER — Encounter (HOSPITAL_COMMUNITY): Payer: Self-pay | Admitting: Gastroenterology

## 2017-01-16 IMAGING — US US ABDOMEN COMPLETE
1 series · 14 of 25 positions shown · non-contrast
Comparison: None.

CLINICAL DATA: Abdominal pain for 4 hours

EXAM:
ABDOMEN ULTRASOUND COMPLETE

[Series 1: us abdomen complete · 0.26mm/px · 14 of 91 slices shown]
[im 1/91]
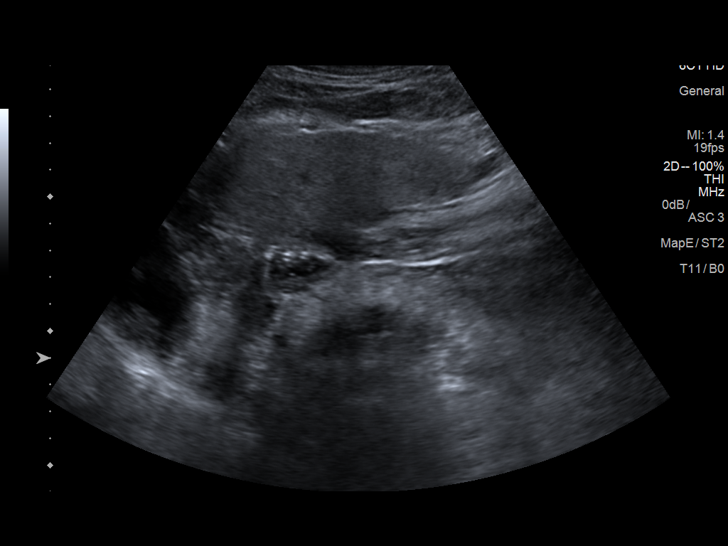
[im 8/91]
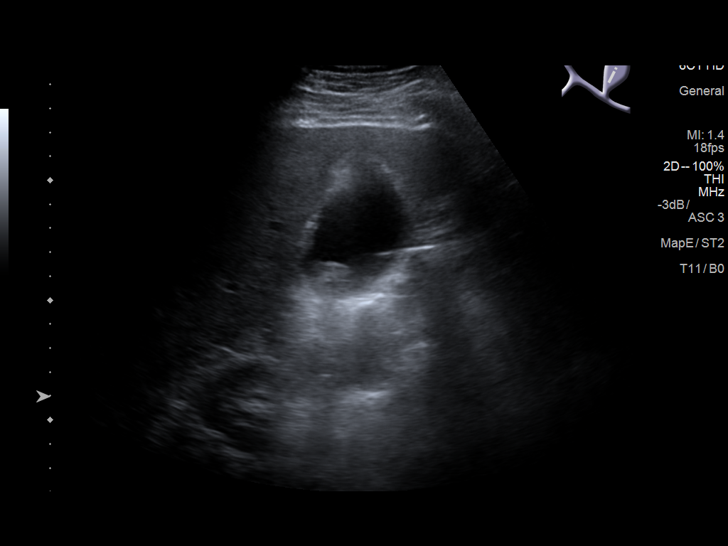
[im 16/91]
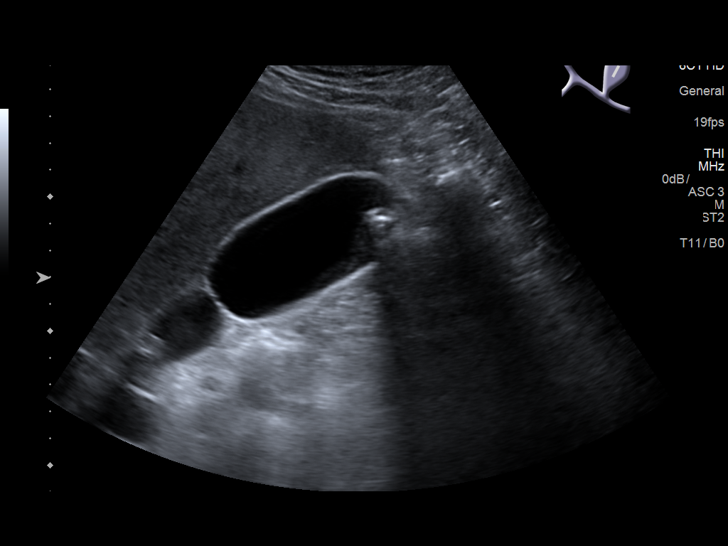
[im 23/91]
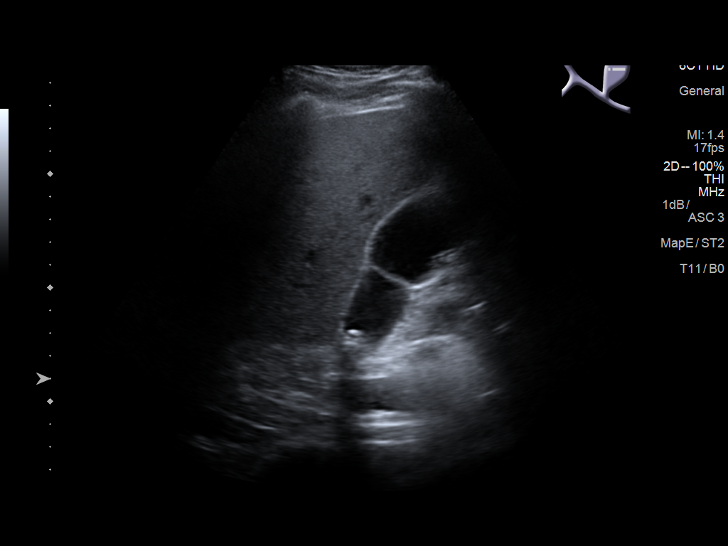
[im 31/91]
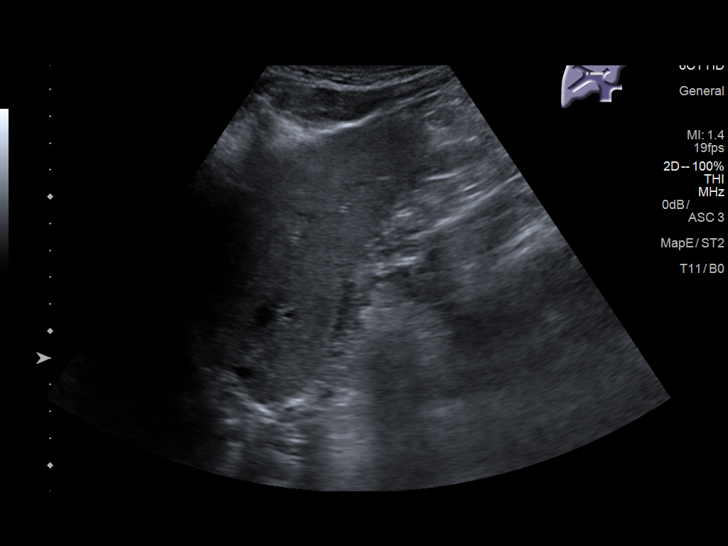
[im 34/91]
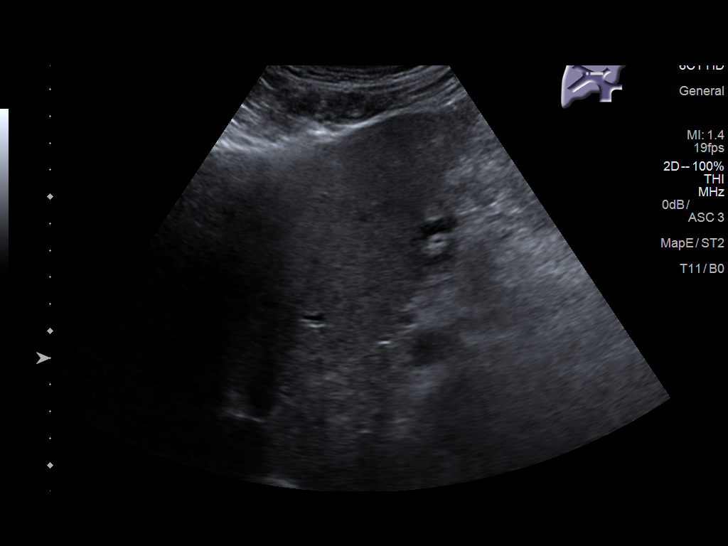
[im 42/91]
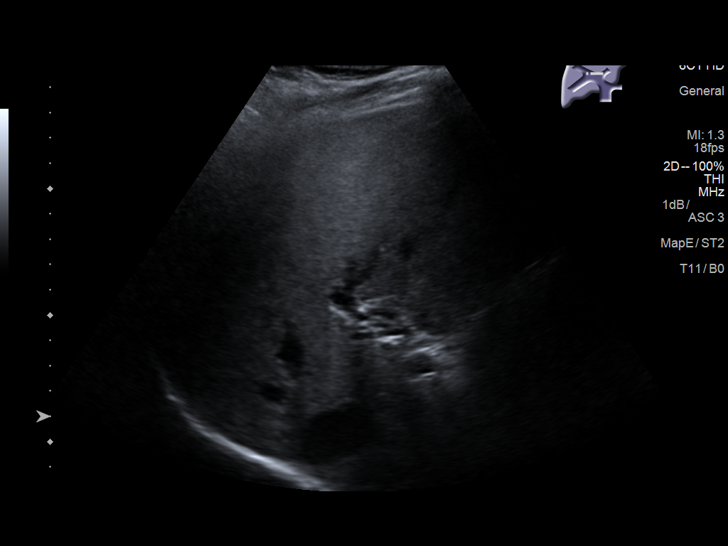
[im 49/91]
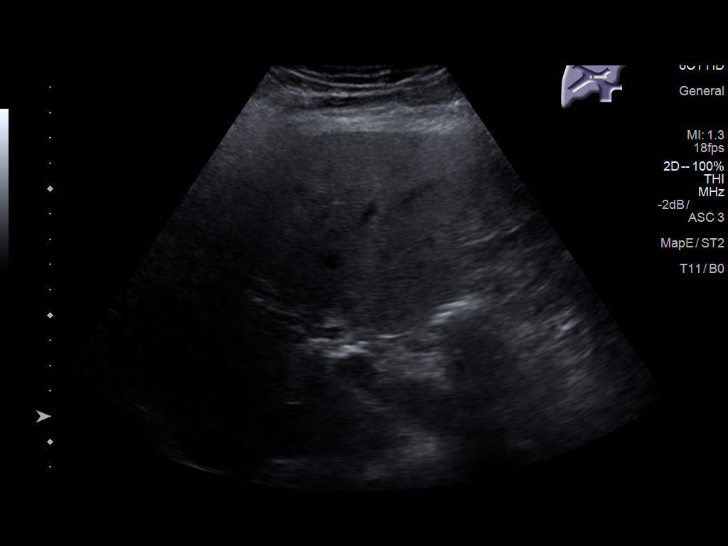
[im 57/91]
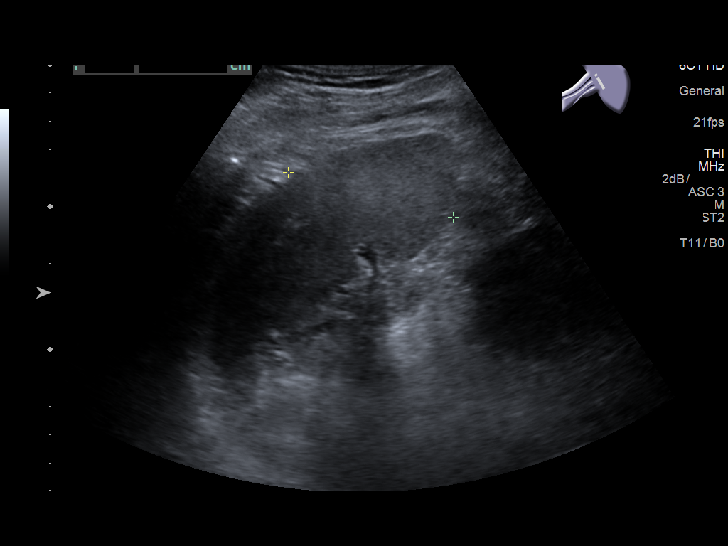
[im 61/91]
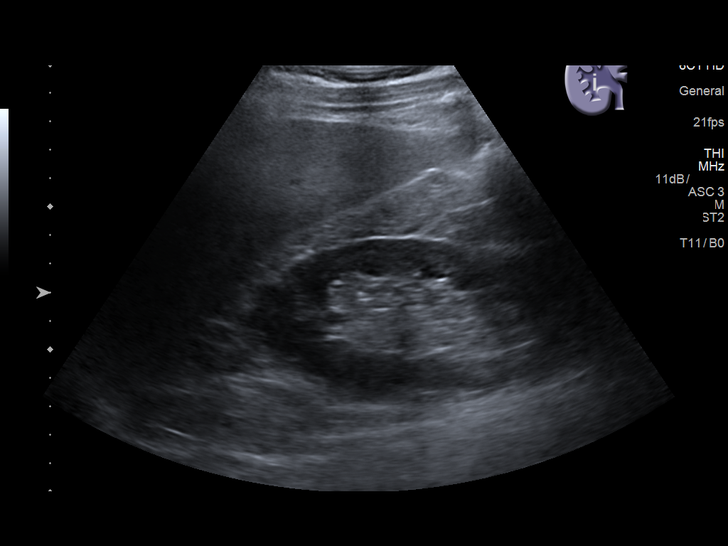
[im 68/91]
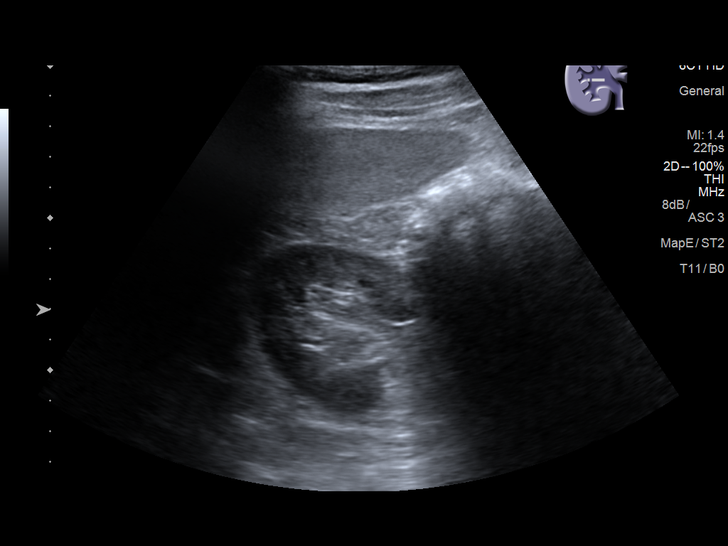
[im 76/91]
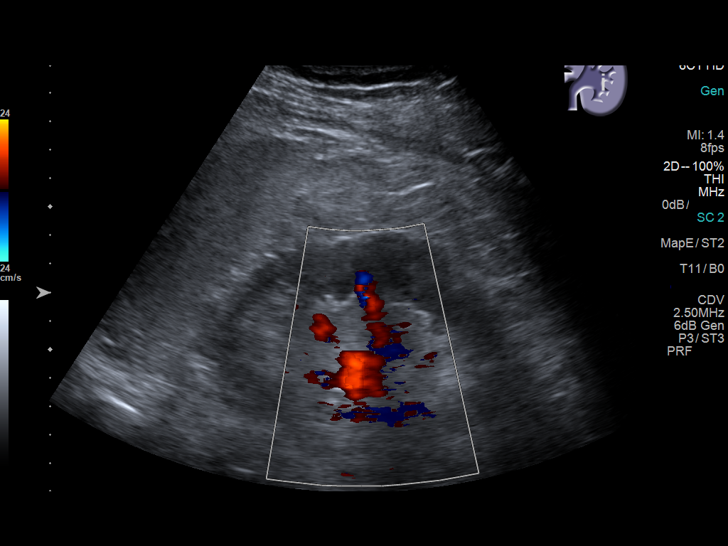
[im 83/91]
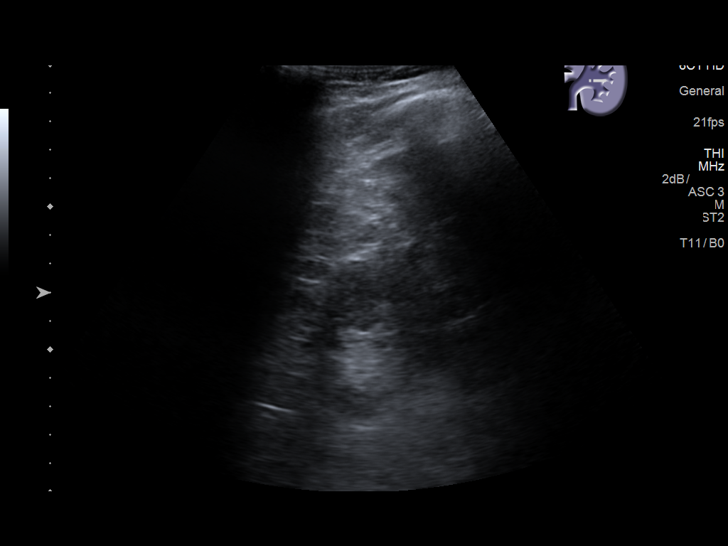
[im 91/91]
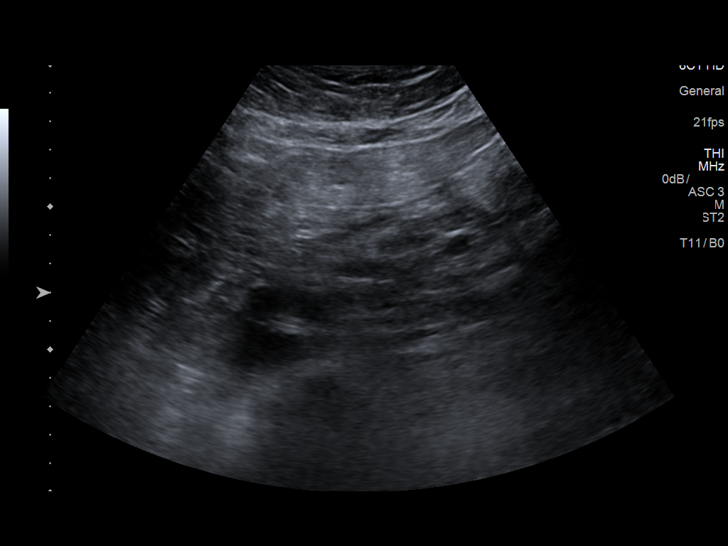

[14 of 25 positions shown; findings below may reference images not displayed]

FINDINGS: Gallbladder: There is cholelithiasis and gallbladder sludge. The
largest stone measures 1.7 cm and is nonmobile, located at the
gallbladder neck. Sonographic Murphy sign could not be assessed
because the patient had an given pain medicine. Gallbladder wall
thickness is normal, measuring 2.7 mm. No pericholecystic fluid.

Common bile duct: Diameter: 4.6 mm, normal

Liver: No focal lesion identified. Within normal limits in
parenchymal echogenicity.

IVC: No abnormality visualized.

Pancreas: Visualized portion unremarkable.

Spleen: Size and appearance within normal limits. Length measures 6
cm.

Right Kidney: Length: 12.1 cm. Echogenicity within normal limits. No
mass or hydronephrosis visualized.

Left Kidney: Length: 11.6 cm. Echogenicity within normal limits. No
mass or hydronephrosis visualized.

Abdominal aorta: No aneurysm visualized. AP diameter measures 2.6 cm

Other findings: None.
IMPRESSION: 1. 1.7 cm calculus at the gallbladder neck. No pericholecystic fluid
or gallbladder wall thickening.
2. Otherwise normal abdominal ultrasound.

## 2019-04-16 ENCOUNTER — Encounter: Payer: Self-pay | Admitting: Physician Assistant

## 2019-04-16 ENCOUNTER — Other Ambulatory Visit: Payer: Self-pay | Admitting: Physician Assistant

## 2019-04-16 DIAGNOSIS — M7022 Olecranon bursitis, left elbow: Secondary | ICD-10-CM | POA: Insufficient documentation

## 2019-04-16 HISTORY — DX: Olecranon bursitis, left elbow: M70.22

## 2019-04-17 ENCOUNTER — Encounter (HOSPITAL_BASED_OUTPATIENT_CLINIC_OR_DEPARTMENT_OTHER)
Admission: RE | Admit: 2019-04-17 | Discharge: 2019-04-17 | Disposition: A | Discharge status: Home / Self Care | Payer: Federal, State, Local not specified - PPO | Source: Ambulatory Visit | Attending: Orthopedic Surgery | Admitting: Orthopedic Surgery

## 2019-04-17 ENCOUNTER — Encounter (HOSPITAL_BASED_OUTPATIENT_CLINIC_OR_DEPARTMENT_OTHER): Payer: Self-pay | Admitting: *Deleted

## 2019-04-17 ENCOUNTER — Other Ambulatory Visit: Payer: Self-pay | Admitting: Physician Assistant

## 2019-04-17 ENCOUNTER — Other Ambulatory Visit: Payer: Self-pay

## 2019-04-17 DIAGNOSIS — Z79899 Other long term (current) drug therapy: Secondary | ICD-10-CM | POA: Diagnosis not present

## 2019-04-17 DIAGNOSIS — E119 Type 2 diabetes mellitus without complications: Secondary | ICD-10-CM

## 2019-04-17 DIAGNOSIS — Z9049 Acquired absence of other specified parts of digestive tract: Secondary | ICD-10-CM | POA: Diagnosis not present

## 2019-04-17 DIAGNOSIS — I10 Essential (primary) hypertension: Secondary | ICD-10-CM | POA: Diagnosis not present

## 2019-04-17 DIAGNOSIS — M778 Other enthesopathies, not elsewhere classified: Secondary | ICD-10-CM | POA: Diagnosis not present

## 2019-04-17 DIAGNOSIS — M7022 Olecranon bursitis, left elbow: Secondary | ICD-10-CM | POA: Diagnosis not present

## 2019-04-17 DIAGNOSIS — M1990 Unspecified osteoarthritis, unspecified site: Secondary | ICD-10-CM | POA: Diagnosis present

## 2019-04-17 LAB — BASIC METABOLIC PANEL
Anion gap: 11 (ref 5–15)
BUN: 24 mg/dL — ABNORMAL HIGH (ref 8–23)
CO2: 27 mmol/L (ref 22–32)
Calcium: 9.3 mg/dL (ref 8.9–10.3)
Chloride: 101 mmol/L (ref 98–111)
Creatinine, Ser: 1.23 mg/dL (ref 0.61–1.24)
GFR calc Af Amer: >60 mL/min (ref >60)
GFR calc non Af Amer: >60 mL/min (ref >60)
Glucose, Bld: 211 mg/dL — ABNORMAL HIGH (ref 70–99)
Potassium: 4.7 mmol/L (ref 3.5–5.1)
Sodium: 139 mmol/L (ref 135–145)

## 2019-04-17 NOTE — Progress Notes (Signed)
G 2  Drink given with instructions to complete by [facility], pt verbalized understanding.

## 2019-04-17 NOTE — Anesthesia Preprocedure Evaluation (Addendum)
Anesthesia Evaluation  Patient identified by MRN, date of birth, ID band Patient awake    Reviewed: Allergy & Precautions, NPO status , Patient's Chart, lab work & pertinent test results  Airway Mallampati: I  TM Distance: >3 FB Neck ROM: Full    Dental no notable dental hx. (+) Teeth Intact, Dental Advisory Given, Caps   Pulmonary neg pulmonary ROS,    Pulmonary exam normal breath sounds clear to auscultation       Cardiovascular hypertension, Pt. on medications Normal cardiovascular exam Rhythm:Regular Rate:Normal     Neuro/Psych negative neurological ROS  negative psych ROS   GI/Hepatic negative GI ROS, Neg liver ROS,   Endo/Other  diabetes, Type 2, Oral Hypoglycemic Agents  Renal/GU Renal InsufficiencyRenal disease     Musculoskeletal negative musculoskeletal ROS (+)   Abdominal   Peds  Hematology negative hematology ROS (+)   Anesthesia Other Findings   Reproductive/Obstetrics                            Lab Results  Component Value Date   WBC 6.7 08/03/2016   HGB 12.4 (L) 08/03/2016   HCT 37.7 (L) 08/03/2016   MCV 93.1 08/03/2016   PLT 139 (L) 08/03/2016   Lab Results  Component Value Date   CREATININE 1.23 04/17/2019   BUN 24 (H) 04/17/2019   NA 139 04/17/2019   K 4.7 04/17/2019   CL 101 04/17/2019   CO2 27 04/17/2019    Anesthesia Physical  Anesthesia Plan  ASA: III  Anesthesia Plan: General   Post-op Pain Management:    Induction: Intravenous  PONV Risk Score and Plan: Ondansetron and Treatment may vary due to age or medical condition  Airway Management Planned: LMA  Additional Equipment:   Intra-op Plan:   Post-operative Plan: Extubation in OR  Informed Consent: I have reviewed the patients History and Physical, chart, labs and discussed the procedure including the risks, benefits and alternatives for the proposed anesthesia with the patient or  authorized representative who has indicated his/her understanding and acceptance.     Dental advisory given  Plan Discussed with: CRNA, Anesthesiologist and Surgeon  Anesthesia Plan Comments: ( )        Anesthesia Quick Evaluation

## 2019-04-17 NOTE — H&P (Signed)
Lawrence Keith is an 68 y.o. male.   Chief Complaint: left elbow draining olecranon bursitis HPI: Patient has had a two week history of draining olecranon bursitis.  Despite conservative care he has been unable to heal the draining bursa.  Going to the OR because the drainage is increasing.  Past Medical History:  Diagnosis Date  . Arthritis   . Diabetes mellitus without complication (HCC)   . Olecranon bursitis of left elbow 04/16/2019    Past Surgical History:  Procedure Laterality Date  . CHOLECYSTECTOMY N/A 07/31/2016   Procedure: LAPAROSCOPIC CHOLECYSTECTOMY WITH INTRAOPERATIVE CHOLANGIOGRAM;  Surgeon: Lawrence Rudd, MD;  Location: [facility] OR;  Service: General;  Laterality: N/A;  . ERCP N/A 08/01/2016   Procedure: ENDOSCOPIC RETROGRADE CHOLANGIOPANCREATOGRAPHY (ERCP);  Surgeon: Lawrence Rigger, MD;  Location: [facility] OR;  Service: Endoscopy;  Laterality: N/A;    History reviewed. No pertinent family history. Social History:  reports that he has never smoked. He has never used smokeless tobacco. He reports that he does not drink alcohol or use drugs.  Allergies: No Known Allergies  No medications prior to admission.    No results found for this or any previous visit (from the past 48 hour(s)). No results found.  Review of Systems  Constitutional: Negative.   HENT: Negative.   Eyes: Negative.   Respiratory: Negative.   Cardiovascular: Negative.   Gastrointestinal: Negative.   Genitourinary: Negative.   Musculoskeletal: Positive for joint pain.  Skin: Negative.   Neurological: Negative.   Endo/Heme/Allergies: Negative.     Height 6\' 3"  (1.905 m), weight 93.4 kg. Physical Exam  Constitutional: He is oriented to person, place, and time. He appears well-developed and well-nourished.  HENT:  Head: Normocephalic and atraumatic.  Eyes: Pupils are equal, round, and reactive to light.  Neck: Neck supple.  Cardiovascular: Normal rate.  Respiratory: Effort normal.  GI: Soft.   Genitourinary:    Genitourinary Comments: Not pertinent to current symptomatology therefore not examined.   Musculoskeletal:     Comments: Left elbow olecranon bursa draining copious amounts of serous fluid.  No pus, No redness.  DNVI Right elbow has full range of motion without pain swelling or drainage  Neurological: He is alert and oriented to person, place, and time.  Skin: Skin is warm and dry.  Psychiatric: He has a normal mood and affect. His behavior is normal.     Assessment/ Principal Problem:   Olecranon bursitis of left elbow Active Problems:   Diabetes mellitus without complication (HCC)   Arthritis  Plan Left elbow olecranon bursitis draining.   The risks, benefits, and possible complications of the procedure were discussed in detail with the patient.  The patient is without question.  Lawrence Keith J Myesha Stillion, PA-C 04/17/2019, 9:55 AM

## 2019-04-18 ENCOUNTER — Ambulatory Visit (HOSPITAL_BASED_OUTPATIENT_CLINIC_OR_DEPARTMENT_OTHER): Payer: Federal, State, Local not specified - PPO | Admitting: Anesthesiology

## 2019-04-18 ENCOUNTER — Encounter (HOSPITAL_BASED_OUTPATIENT_CLINIC_OR_DEPARTMENT_OTHER)
Admission: RE | Disposition: A | Discharge status: Home / Self Care | Payer: Self-pay | Source: Home / Self Care | Attending: Orthopedic Surgery

## 2019-04-18 ENCOUNTER — Other Ambulatory Visit: Payer: Self-pay

## 2019-04-18 ENCOUNTER — Ambulatory Visit (HOSPITAL_BASED_OUTPATIENT_CLINIC_OR_DEPARTMENT_OTHER)
Admission: RE | Admit: 2019-04-18 | Discharge: 2019-04-18 | Disposition: A | Discharge status: Home / Self Care | Payer: Federal, State, Local not specified - PPO | Attending: Orthopedic Surgery | Admitting: Orthopedic Surgery

## 2019-04-18 ENCOUNTER — Encounter (HOSPITAL_BASED_OUTPATIENT_CLINIC_OR_DEPARTMENT_OTHER): Payer: Self-pay | Admitting: Emergency Medicine

## 2019-04-18 DIAGNOSIS — M7022 Olecranon bursitis, left elbow: Secondary | ICD-10-CM | POA: Diagnosis not present

## 2019-04-18 DIAGNOSIS — M1990 Unspecified osteoarthritis, unspecified site: Secondary | ICD-10-CM | POA: Diagnosis present

## 2019-04-18 DIAGNOSIS — M778 Other enthesopathies, not elsewhere classified: Secondary | ICD-10-CM | POA: Insufficient documentation

## 2019-04-18 DIAGNOSIS — Z9049 Acquired absence of other specified parts of digestive tract: Secondary | ICD-10-CM | POA: Insufficient documentation

## 2019-04-18 DIAGNOSIS — I10 Essential (primary) hypertension: Secondary | ICD-10-CM | POA: Insufficient documentation

## 2019-04-18 DIAGNOSIS — E119 Type 2 diabetes mellitus without complications: Secondary | ICD-10-CM

## 2019-04-18 DIAGNOSIS — Z79899 Other long term (current) drug therapy: Secondary | ICD-10-CM | POA: Insufficient documentation

## 2019-04-18 HISTORY — DX: Unspecified osteoarthritis, unspecified site: M19.90

## 2019-04-18 HISTORY — PX: OLECRANON BURSECTOMY: SHX2097

## 2019-04-18 LAB — GLUCOSE, CAPILLARY
Glucose-Capillary: 121 mg/dL — ABNORMAL HIGH (ref 70–99)
Glucose-Capillary: 99 mg/dL (ref 70–99)

## 2019-04-18 SURGERY — BURSECTOMY, ELBOW
Anesthesia: General | Site: Elbow | Laterality: Left

## 2019-04-18 MED ORDER — POVIDONE-IODINE 7.5 % EX SOLN: Freq: Once | CUTANEOUS | Status: DC

## 2019-04-18 MED ORDER — MIDAZOLAM HCL 2 MG/2ML IJ SOLN: 1.0000 mg | INTRAMUSCULAR | Status: DC | PRN

## 2019-04-18 MED ORDER — HYDROCODONE-ACETAMINOPHEN 5-325 MG PO TABS: 1.0000 | ORAL_TABLET | ORAL | 0 refills | Status: DC | PRN

## 2019-04-18 MED ORDER — KETOROLAC TROMETHAMINE 30 MG/ML IJ SOLN
INTRAMUSCULAR | Status: DC | PRN
  Administered 2019-04-18: 30 mg via INTRAVENOUS

## 2019-04-18 MED ORDER — ACETAMINOPHEN 325 MG PO TABS: 325.0000 mg | ORAL_TABLET | ORAL | Status: DC | PRN

## 2019-04-18 MED ORDER — CEFAZOLIN SODIUM-DEXTROSE 2-4 GM/100ML-% IV SOLN
INTRAVENOUS | Status: AC
  Filled 2019-04-18: qty 100

## 2019-04-18 MED ORDER — LIDOCAINE 2% (20 MG/ML) 5 ML SYRINGE
INTRAMUSCULAR | Status: AC
  Filled 2019-04-18: qty 5

## 2019-04-18 MED ORDER — ONDANSETRON HCL 4 MG/2ML IJ SOLN
INTRAMUSCULAR | Status: AC
  Filled 2019-04-18: qty 2

## 2019-04-18 MED ORDER — OXYCODONE HCL 5 MG PO TABS: 5.0000 mg | ORAL_TABLET | Freq: Once | ORAL | Status: DC | PRN

## 2019-04-18 MED ORDER — CEFAZOLIN SODIUM-DEXTROSE 2-4 GM/100ML-% IV SOLN
2.0000 g | INTRAVENOUS | Status: AC
  Administered 2019-04-18: 2 g via INTRAVENOUS

## 2019-04-18 MED ORDER — ONDANSETRON HCL 4 MG/2ML IJ SOLN
4.0000 mg | Freq: Once | INTRAMUSCULAR | Status: AC | PRN
  Administered 2019-04-18: 11:00:00 4 mg via INTRAVENOUS

## 2019-04-18 MED ORDER — BUPIVACAINE HCL (PF) 0.5 % IJ SOLN
INTRAMUSCULAR | Status: AC
  Filled 2019-04-18: qty 30

## 2019-04-18 MED ORDER — LACTATED RINGERS IV SOLN
INTRAVENOUS | Status: DC
  Administered 2019-04-18: 09:00:00 via INTRAVENOUS

## 2019-04-18 MED ORDER — FENTANYL CITRATE (PF) 100 MCG/2ML IJ SOLN
INTRAMUSCULAR | Status: AC
  Filled 2019-04-18: qty 2

## 2019-04-18 MED ORDER — POVIDONE-IODINE 10 % EX SWAB: 2.0000 "application " | Freq: Once | CUTANEOUS | Status: DC

## 2019-04-18 MED ORDER — ACETAMINOPHEN 160 MG/5ML PO SOLN: 325.0000 mg | ORAL | Status: DC | PRN

## 2019-04-18 MED ORDER — BUPIVACAINE HCL (PF) 0.25 % IJ SOLN
INTRAMUSCULAR | Status: DC | PRN
  Administered 2019-04-18: 7 mL

## 2019-04-18 MED ORDER — FENTANYL CITRATE (PF) 100 MCG/2ML IJ SOLN
50.0000 ug | INTRAMUSCULAR | Status: DC | PRN
  Administered 2019-04-18: 100 ug via INTRAVENOUS

## 2019-04-18 MED ORDER — DEXAMETHASONE SODIUM PHOSPHATE 4 MG/ML IJ SOLN
INTRAMUSCULAR | Status: DC | PRN
  Administered 2019-04-18: 10 mg via INTRAVENOUS

## 2019-04-18 MED ORDER — DEXAMETHASONE SODIUM PHOSPHATE 10 MG/ML IJ SOLN
INTRAMUSCULAR | Status: AC
  Filled 2019-04-18: qty 1

## 2019-04-18 MED ORDER — KETOROLAC TROMETHAMINE 30 MG/ML IJ SOLN
INTRAMUSCULAR | Status: AC
  Filled 2019-04-18: qty 1

## 2019-04-18 MED ORDER — MIDAZOLAM HCL 2 MG/2ML IJ SOLN
INTRAMUSCULAR | Status: AC
  Filled 2019-04-18: qty 2

## 2019-04-18 MED ORDER — LACTATED RINGERS IV SOLN: INTRAVENOUS | Status: DC

## 2019-04-18 MED ORDER — PROPOFOL 500 MG/50ML IV EMUL
INTRAVENOUS | Status: AC
  Filled 2019-04-18: qty 100

## 2019-04-18 MED ORDER — PROPOFOL 500 MG/50ML IV EMUL
INTRAVENOUS | Status: AC
  Filled 2019-04-18: qty 50

## 2019-04-18 MED ORDER — MEPERIDINE HCL 25 MG/ML IJ SOLN: 6.2500 mg | INTRAMUSCULAR | Status: DC | PRN

## 2019-04-18 MED ORDER — PROPOFOL 10 MG/ML IV BOLUS
INTRAVENOUS | Status: DC | PRN
  Administered 2019-04-18: 200 mg via INTRAVENOUS

## 2019-04-18 MED ORDER — FENTANYL CITRATE (PF) 100 MCG/2ML IJ SOLN: 25.0000 ug | INTRAMUSCULAR | Status: DC | PRN

## 2019-04-18 MED ORDER — BUPIVACAINE HCL (PF) 0.25 % IJ SOLN
INTRAMUSCULAR | Status: AC
  Filled 2019-04-18: qty 30

## 2019-04-18 MED ORDER — OXYCODONE HCL 5 MG/5ML PO SOLN: 5.0000 mg | Freq: Once | ORAL | Status: DC | PRN

## 2019-04-18 MED ORDER — LIDOCAINE HCL (CARDIAC) PF 100 MG/5ML IV SOSY
PREFILLED_SYRINGE | INTRAVENOUS | Status: DC | PRN
  Administered 2019-04-18: 80 mg via INTRAVENOUS

## 2019-04-18 MED ORDER — SCOPOLAMINE 1 MG/3DAYS TD PT72: 1.0000 | MEDICATED_PATCH | Freq: Once | TRANSDERMAL | Status: DC | PRN

## 2019-04-18 MED ORDER — CEPHALEXIN 500 MG PO CAPS: 500.0000 mg | ORAL_CAPSULE | Freq: Four times a day (QID) | ORAL | 0 refills | Status: AC

## 2019-04-18 SURGICAL SUPPLY — 74 items
BANDAGE ACE 4X5 VEL STRL LF (GAUZE/BANDAGES/DRESSINGS) ×3 IMPLANT
BANDAGE ACE 6X5 VEL STRL LF (GAUZE/BANDAGES/DRESSINGS) ×3 IMPLANT
BANDAGE ELASTIC 4 VELCRO ST LF (GAUZE/BANDAGES/DRESSINGS) ×2 IMPLANT
BLADE SURG 15 STRL LF DISP TIS (BLADE) ×1 IMPLANT
BLADE SURG 15 STRL SS (BLADE) ×6
BNDG CMPR 9X4 STRL LF SNTH (GAUZE/BANDAGES/DRESSINGS) ×1
BNDG COHESIVE 4X5 TAN STRL (GAUZE/BANDAGES/DRESSINGS) ×3 IMPLANT
BNDG ESMARK 4X9 LF (GAUZE/BANDAGES/DRESSINGS) ×3 IMPLANT
CLOSURE WOUND 1/2 X4 (GAUZE/BANDAGES/DRESSINGS) ×1
COVER BACK TABLE REUSABLE LG (DRAPES) ×3 IMPLANT
COVER MAYO STAND REUSABLE (DRAPES) ×3 IMPLANT
COVER WAND RF STERILE (DRAPES) IMPLANT
DRAPE EXTREMITY T 121X128X90 (DISPOSABLE) ×3 IMPLANT
DRAPE OEC MINIVIEW 54X84 (DRAPES) ×2 IMPLANT
DRAPE U-SHAPE 47X51 STRL (DRAPES) ×3 IMPLANT
DRSG EMULSION OIL 3X3 NADH (GAUZE/BANDAGES/DRESSINGS) ×3 IMPLANT
DRSG PAD ABDOMINAL 8X10 ST (GAUZE/BANDAGES/DRESSINGS) ×2 IMPLANT
DURAPREP 26ML APPLICATOR (WOUND CARE) ×3 IMPLANT
ELECT REM PT RETURN 9FT ADLT (ELECTROSURGICAL) ×3
ELECTRODE REM PT RTRN 9FT ADLT (ELECTROSURGICAL) ×1 IMPLANT
GAUZE SPONGE 4X4 12PLY STRL (GAUZE/BANDAGES/DRESSINGS) ×3 IMPLANT
GAUZE XEROFORM 1X8 LF (GAUZE/BANDAGES/DRESSINGS) ×3 IMPLANT
GLOVE BIO SURGEON STRL SZ7 (GLOVE) ×3 IMPLANT
GLOVE BIOGEL PI IND STRL 7.0 (GLOVE) ×1 IMPLANT
GLOVE BIOGEL PI IND STRL 7.5 (GLOVE) ×1 IMPLANT
GLOVE BIOGEL PI INDICATOR 7.0 (GLOVE) ×2
GLOVE BIOGEL PI INDICATOR 7.5 (GLOVE) ×4
GLOVE SS BIOGEL STRL SZ 7.5 (GLOVE) ×1 IMPLANT
GLOVE SUPERSENSE BIOGEL SZ 7.5 (GLOVE) ×2
GOWN STRL REUS W/ TWL LRG LVL3 (GOWN DISPOSABLE) ×2 IMPLANT
GOWN STRL REUS W/ TWL XL LVL3 (GOWN DISPOSABLE) ×1 IMPLANT
GOWN STRL REUS W/TWL LRG LVL3 (GOWN DISPOSABLE) ×6
GOWN STRL REUS W/TWL XL LVL3 (GOWN DISPOSABLE) ×3
NDL ADDISON D1/2 CIR (NEEDLE) IMPLANT
NDL HYPO 25X1 1.5 SAFETY (NEEDLE) ×1 IMPLANT
NDL SUT 6 .5 CRC .975X.05 MAYO (NEEDLE) IMPLANT
NEEDLE ADDISON D1/2 CIR (NEEDLE) IMPLANT
NEEDLE HYPO 25X1 1.5 SAFETY (NEEDLE) ×3 IMPLANT
NEEDLE MAYO TAPER (NEEDLE)
NS IRRIG 1000ML POUR BTL (IV SOLUTION) ×3 IMPLANT
PACK BASIN DAY SURGERY FS (CUSTOM PROCEDURE TRAY) ×3 IMPLANT
PAD CAST 3X4 CTTN HI CHSV (CAST SUPPLIES) ×1 IMPLANT
PAD CAST 4YDX4 CTTN HI CHSV (CAST SUPPLIES) ×1 IMPLANT
PADDING CAST ABS 4INX4YD NS (CAST SUPPLIES) ×2
PADDING CAST ABS COTTON 4X4 ST (CAST SUPPLIES) ×1 IMPLANT
PADDING CAST COTTON 3X4 STRL (CAST SUPPLIES) ×3
PADDING CAST COTTON 4X4 STRL (CAST SUPPLIES) ×3
PASSER SUT SWANSON 36MM LOOP (INSTRUMENTS) IMPLANT
PENCIL BUTTON HOLSTER BLD 10FT (ELECTRODE) ×3 IMPLANT
SLEEVE SCD COMPRESS KNEE MED (MISCELLANEOUS) ×2 IMPLANT
SLING ARM FOAM STRAP LRG (SOFTGOODS) ×4 IMPLANT
SPLINT FAST PLASTER 5X30 (CAST SUPPLIES)
SPLINT PLASTER CAST FAST 5X30 (CAST SUPPLIES) IMPLANT
SPLINT PLASTER CAST XFAST 4X15 (CAST SUPPLIES) IMPLANT
SPLINT PLASTER XTRA FAST SET 4 (CAST SUPPLIES)
STOCKINETTE 4X48 STRL (DRAPES) ×3 IMPLANT
STOCKINETTE IMPERVIOUS LG (DRAPES) ×3 IMPLANT
STRIP CLOSURE SKIN 1/2X4 (GAUZE/BANDAGES/DRESSINGS) ×2 IMPLANT
SUCTION FRAZIER HANDLE 10FR (MISCELLANEOUS) ×2
SUCTION TUBE FRAZIER 10FR DISP (MISCELLANEOUS) IMPLANT
SUT ETHILON 4 0 PS 2 18 (SUTURE) ×3 IMPLANT
SUT ETHILON 5 0 P 3 18 (SUTURE)
SUT FIBERWIRE 2-0 18 17.9 3/8 (SUTURE)
SUT NYLON ETHILON 5-0 P-3 1X18 (SUTURE) IMPLANT
SUT PROLENE 3 0 PS 2 (SUTURE) IMPLANT
SUT VIC AB 0 CT1 27 (SUTURE)
SUT VIC AB 0 CT1 27XBRD ANBCTR (SUTURE) IMPLANT
SUTURE FIBERWR 2-0 18 17.9 3/8 (SUTURE) IMPLANT
SYR BULB 3OZ (MISCELLANEOUS) ×5 IMPLANT
SYR CONTROL 10ML LL (SYRINGE) ×3 IMPLANT
TOWEL GREEN STERILE FF (TOWEL DISPOSABLE) ×6 IMPLANT
TUBE CONNECTING 20'X1/4 (TUBING) ×1
TUBE CONNECTING 20X1/4 (TUBING) ×1 IMPLANT
UNDERPAD 30X30 (UNDERPADS AND DIAPERS) ×3 IMPLANT

## 2019-04-18 NOTE — Discharge Instructions (Signed)
° ° °  Do not take any nonsteroidal anti inflammatories until after 5:30 pm today.   Post Anesthesia Home Care Instructions  Activity: Get plenty of rest for the remainder of the day. A responsible individual must stay with you for 24 hours following the procedure.  For the next 24 hours, DO NOT: -Drive a car -Advertising copywriter -Drink alcoholic beverages -Take any medication unless instructed by your physician -Make any legal decisions or sign important papers.  Meals: Start with liquid foods such as gelatin or soup. Progress to regular foods as tolerated. Avoid greasy, spicy, heavy foods. If nausea and/or vomiting occur, drink only clear liquids until the nausea and/or vomiting subsides. Call your physician if vomiting continues.  Special Instructions/Symptoms: Your throat may feel dry or sore from the anesthesia or the breathing tube placed in your throat during surgery. If this causes discomfort, gargle with warm salt water. The discomfort should disappear within 24 hours.  If you had a scopolamine patch placed behind your ear for the management of post- operative nausea and/or vomiting:  1. The medication in the patch is effective for 72 hours, after which it should be removed.  Wrap patch in a tissue and discard in the trash. Wash hands thoroughly with soap and water. 2. You may remove the patch earlier than 72 hours if you experience unpleasant side effects which may include dry mouth, dizziness or visual disturbances. 3. Avoid touching the patch. Wash your hands with soap and water after contact with the patch.

## 2019-04-18 NOTE — Interval H&P Note (Signed)
History and Physical Interval Note:  04/18/2019 10:48 AM  Lawrence Keith  has presented today for surgery, with the diagnosis of LEFT ELBOW SEPTIC OLECRANON BURSA.  The various methods of treatment have been discussed with the patient and family. After consideration of risks, benefits and other options for treatment, the patient has consented to  Procedure(s): OLECRANON BURSA (Left) as a surgical intervention.  The patient's history has been reviewed, patient examined, no change in status, stable for surgery.  I have reviewed the patient's chart and labs.  Questions were answered to the patient's satisfaction.     Nilda Simmer

## 2019-04-18 NOTE — Anesthesia Postprocedure Evaluation (Signed)
Anesthesia Post Note  Patient: Lawrence Keith  Procedure(s) Performed: OLECRANON BURSA (Left Elbow)     Patient location during evaluation: PACU Anesthesia Type: General Level of consciousness: awake and alert Pain management: pain level controlled Vital Signs Assessment: post-procedure vital signs reviewed and stable Respiratory status: spontaneous breathing, nonlabored ventilation, respiratory function stable and patient connected to nasal cannula oxygen Cardiovascular status: blood pressure returned to baseline and stable Postop Assessment: no apparent nausea or vomiting Anesthetic complications: no    Last Vitals:  Vitals:   04/18/19 1219 04/18/19 1235  BP: 119/86 130/86  Pulse: 62 (!) 57  Resp: 13 16  Temp:  36.5 C  SpO2: 96% 94%    Last Pain:  Vitals:   04/18/19 1300  TempSrc:   PainSc: 0-No pain                 Aunesty Tyson

## 2019-04-18 NOTE — Transfer of Care (Signed)
Immediate Anesthesia Transfer of Care Note  Patient: Lawrence Keith  Procedure(s) Performed: Beverely Risen BURSA (Left Elbow)  Patient Location: PACU  Anesthesia Type:General  Level of Consciousness: oriented and patient cooperative  Airway & Oxygen Therapy: Patient Spontanous Breathing and Patient connected to nasal cannula oxygen  Post-op Assessment: Report given to RN and Post -op Vital signs reviewed and stable  Post vital signs: Reviewed and stable  Last Vitals:  Vitals Value Taken Time  BP 129/85 04/18/2019 11:47 AM  Temp    Pulse 63 04/18/2019 11:49 AM  Resp 11 04/18/2019 11:49 AM  SpO2 98 % 04/18/2019 11:49 AM  Vitals shown include unvalidated device data.  Last Pain:  Vitals:   04/18/19 0821  TempSrc: Oral  PainSc: 0-No pain         Complications: No apparent anesthesia complications

## 2019-04-18 NOTE — Anesthesia Procedure Notes (Signed)
Procedure Name: LMA Insertion Date/Time: 04/18/2019 10:59 AM Performed by: Gar Gibbon, CRNA Pre-anesthesia Checklist: Patient identified, Emergency Drugs available, Suction available and Patient being monitored Patient Re-evaluated:Patient Re-evaluated prior to induction Oxygen Delivery Method: Circle system utilized Preoxygenation: Pre-oxygenation with 100% oxygen Induction Type: IV induction Ventilation: Mask ventilation without difficulty LMA: LMA inserted LMA Size: 5.0 Number of attempts: 1 Airway Equipment and Method: Bite block Placement Confirmation: positive ETCO2 Tube secured with: Tape Dental Injury: Teeth and Oropharynx as per pre-operative assessment

## 2019-04-19 ENCOUNTER — Encounter (HOSPITAL_BASED_OUTPATIENT_CLINIC_OR_DEPARTMENT_OTHER): Payer: Self-pay | Admitting: Orthopedic Surgery

## 2019-04-23 LAB — AEROBIC/ANAEROBIC CULTURE W GRAM STAIN (SURGICAL/DEEP WOUND): Culture: NO GROWTH

## 2019-04-23 LAB — AEROBIC/ANAEROBIC CULTURE (SURGICAL/DEEP WOUND)

## 2019-04-23 NOTE — Op Note (Signed)
NAME: Lawrence Keith, Lawrence Keith MEDICAL RECORD [id number] ACCOUNT [id number] DATE OF BIRTH:1950-12-28 FACILITY: [facility] LOCATION: [facility] PHYSICIAN:Saja Bartolini Salley Slaughter, MD  OPERATIVE REPORT  DATE OF PROCEDURE:  04/18/2019  PREOPERATIVE DIAGNOSES: 1.  Left elbow septic olecranon bursitis. 2.  Left elbow olecranon spur.  POSTOPERATIVE DIAGNOSES: 1.  Left elbow septic olecranon bursitis. 2.  Left elbow olecranon spur.  PROCEDURE: 1.  Left elbow olecranon bursectomy. 2.  Left elbow olecranon spur excision.  SURGEON:  Salvatore Marvel, MD  ASSISTANT:  Julien Girt, PA  ANESTHESIA:  General.  OPERATIVE TIME:  45 minutes.  COMPLICATIONS:  None.  INDICATIONS:  The patient is a 68 year old gentleman who has had 3-4 weeks of significant drainage from the left elbow olecranon bursitis with a fissure or sinus tract in this area with intermittent superficial infection.  He has not responded to  conservative care and is now to undergo olecranon bursectomy with spur excision.  DESCRIPTION OF PROCEDURE:  The patient was brought to the operating room on 04/18/2019 and placed on the operating table in supine position.  After being placed under general anesthesia, his left elbow was examined.  He had full range of motion.  Elbow  was stable with obvious olecranon bursitis with a draining sinus tract.  His left arm was prepped using sterile ChloraPrep and draped using sterile technique.  Time-out procedure was called, and the correct left elbow was identified.  The left arm was  exsanguinated and a tourniquet elevated to 250 mm. Initially, through a 3 cm transverse incision, the initial incision was made, and I elliptically excised the sinus tract while doing this initial incision.  The fluid in the bursa sac and some bursal  fluid were sent for cultures.  There was very thick granular bursal fluid, but no obvious purulence.  A bursectomy was completely carried out, protecting the ulnar and radial  nerves.  The spur in the olecranon was easily found.  It was significant,  measuring 1 x 1.5 cm and was removed with a rongeur.  The surrounding triceps tendon around this had to be partially incised to remove this spur.  The excision was documented by intraoperative fluoroscopy.  After the spur excision was carried out and the  bursectomy was carried out, the underlying triceps tendon and fascia over the olecranon was found to be normal.  The wound was then irrigated and closed with interrupted 4-0 nylon suture.  Sterile dressings were applied and a soft dressing.  Then the  tourniquet was released and the patient awakened and taken to recovery room in stable condition.  Needle and sponge counts correct x2 at the end of the case.  FOLLOWUP CARE:  The patient to be followed as an outpatient on Keflex and hydrocodone.  He will be seen back in the office in a week for a wound check and followup.  LN/NUANCE  D:04/23/2019 T:04/23/2019 JOB:[id number]

## 2019-06-26 ENCOUNTER — Other Ambulatory Visit: Payer: Self-pay

## 2019-06-26 ENCOUNTER — Encounter (HOSPITAL_BASED_OUTPATIENT_CLINIC_OR_DEPARTMENT_OTHER): Payer: Self-pay | Admitting: *Deleted

## 2019-06-27 ENCOUNTER — Encounter (HOSPITAL_BASED_OUTPATIENT_CLINIC_OR_DEPARTMENT_OTHER)
Admission: RE | Admit: 2019-06-27 | Discharge: 2019-06-27 | Disposition: A | Discharge status: Home / Self Care | Payer: Federal, State, Local not specified - PPO | Source: Ambulatory Visit | Attending: Orthopedic Surgery | Admitting: Orthopedic Surgery

## 2019-06-27 DIAGNOSIS — Z01812 Encounter for preprocedural laboratory examination: Secondary | ICD-10-CM | POA: Diagnosis not present

## 2019-06-27 LAB — BASIC METABOLIC PANEL
Anion gap: 11 (ref 5–15)
BUN: 18 mg/dL (ref 8–23)
CO2: 23 mmol/L (ref 22–32)
Calcium: 9.3 mg/dL (ref 8.9–10.3)
Chloride: 105 mmol/L (ref 98–111)
Creatinine, Ser: 1.2 mg/dL (ref 0.61–1.24)
GFR calc Af Amer: >60 mL/min (ref >60)
GFR calc non Af Amer: >60 mL/min (ref >60)
Glucose, Bld: 169 mg/dL — ABNORMAL HIGH (ref 70–99)
Potassium: 4.2 mmol/L (ref 3.5–5.1)
Sodium: 139 mmol/L (ref 135–145)

## 2019-06-27 NOTE — H&P (Signed)
Maryanna ShapeRichard E Barge is an 68 y.o. male.   Chief Complaint: left elbow recurrent olecranon bursitis HPI: 68 yowm  2 months s/p olecranon bursectomy with recurrent sinus draining from the tip of his elbow.  Plan for revision olecranon bursectomy.  Past Medical History:  Diagnosis Date  . Arthritis   . Diabetes mellitus without complication (HCC)   . Olecranon bursitis of left elbow 04/16/2019    Past Surgical History:  Procedure Laterality Date  . CHOLECYSTECTOMY N/A 07/31/2016   Procedure: LAPAROSCOPIC CHOLECYSTECTOMY WITH INTRAOPERATIVE CHOLANGIOGRAM;  Surgeon: Manus RuddMatthew Tsuei, MD;  Location: [facility] OR;  Service: General;  Laterality: N/A;  . ERCP N/A 08/01/2016   Procedure: ENDOSCOPIC RETROGRADE CHOLANGIOPANCREATOGRAPHY (ERCP);  Surgeon: Vida RiggerMarc Magod, MD;  [facility];  Service: Endoscopy;  Laterality: N/A;  . OLECRANON BURSECTOMY Left 04/18/2019   Procedure: OLECRANON BURSA;  Surgeon: Salvatore MarvelWainer, Robert, MD;  Location: [facility];  Service: Orthopedics;  Laterality: Left;    History reviewed. No pertinent family history. Social History:  reports that he has never smoked. He has never used smokeless tobacco. He reports that he does not drink alcohol or use drugs.  Allergies: No Known Allergies  No medications prior to admission.    Results for orders placed or performed during the hospital encounter of 07/04/19 (from the past 48 hour(s))  Basic metabolic panel     Status: Abnormal   Collection Time: 06/27/19 11:19 AM  Result Value Ref Range   Sodium 139 135 - 145 mmol/L   Potassium 4.2 3.5 - 5.1 mmol/L   Chloride 105 98 - 111 mmol/L   CO2 23 22 - 32 mmol/L   Glucose, Bld 169 (H) 70 - 99 mg/dL   BUN 18 8 - 23 mg/dL   Creatinine, Ser 1.611.20 0.61 - 1.24 mg/dL   Calcium 9.3 8.9 - 09.610.3 mg/dL   GFR calc non Af Amer >60 >60 mL/min   GFR calc Af Amer >60 >60 mL/min   Anion gap 11 5 - 15    Comment: Performed at [facility], [address] [place], [place] [address]   No  results found.  Review of Systems  Constitutional: Negative.   HENT: Negative.   Eyes: Negative.   Respiratory: Negative.   Cardiovascular: Negative.   Gastrointestinal: Negative.   Genitourinary: Negative.   Musculoskeletal: Positive for joint pain.  Skin: Negative.   Neurological: Negative.   Endo/Heme/Allergies: Negative.   Psychiatric/Behavioral: Negative.     Height 6\' 3"  (1.905 m), weight 95.3 kg. Physical Exam  Constitutional: He is oriented to person, place, and time. He appears well-developed and well-nourished.  HENT:  Head: Normocephalic and atraumatic.  Mouth/Throat: Oropharynx is clear and moist.  Eyes: Pupils are equal, round, and reactive to light. Conjunctivae are normal.  Neck: Neck supple.  Cardiovascular: Normal rate.  Respiratory: Effort normal.  GI: Soft.  Genitourinary:    Genitourinary Comments: Not pertinent to current symptomatology therefore not examined.   Musculoskeletal:     Comments: Left elbow recurrent serous drainage from a small hole in the skin over the olecranon.  No pus, No redness.  DNVI   Right elbow has full range of motion without pain swelling or deformity  Neurological: He is alert and oriented to person, place, and time.  Skin: Skin is warm and dry.  Psychiatric: He has a normal mood and affect. His behavior is normal.     Assessment Principal Problem:   Olecranon bursitis of left elbow Active Problems:  Diabetes mellitus without complication ([place])    Plan Left elbow revision bursectomy.  The risks, benefits, and possible complications of the procedure were discussed in detail with the patient.  The patient is without question.  Jonathon Tan Earl Lagos, PA-C 06/27/2019, 6:37 PM

## 2019-06-30 ENCOUNTER — Other Ambulatory Visit (HOSPITAL_COMMUNITY)
Admission: RE | Admit: 2019-06-30 | Discharge: 2019-06-30 | Disposition: A | Discharge status: Home / Self Care | Payer: Federal, State, Local not specified - PPO | Source: Ambulatory Visit | Attending: Orthopedic Surgery | Admitting: Orthopedic Surgery

## 2019-06-30 DIAGNOSIS — Z1159 Encounter for screening for other viral diseases: Secondary | ICD-10-CM | POA: Insufficient documentation

## 2019-06-30 LAB — SARS CORONAVIRUS 2 (TAT 6-24 HRS): SARS Coronavirus 2: NEGATIVE

## 2019-07-04 ENCOUNTER — Ambulatory Visit (HOSPITAL_BASED_OUTPATIENT_CLINIC_OR_DEPARTMENT_OTHER): Payer: Federal, State, Local not specified - PPO | Admitting: Anesthesiology

## 2019-07-04 ENCOUNTER — Other Ambulatory Visit: Payer: Self-pay

## 2019-07-04 ENCOUNTER — Encounter (HOSPITAL_BASED_OUTPATIENT_CLINIC_OR_DEPARTMENT_OTHER)
Admission: RE | Disposition: A | Discharge status: Home / Self Care | Payer: Self-pay | Source: Home / Self Care | Attending: Orthopedic Surgery

## 2019-07-04 ENCOUNTER — Ambulatory Visit (HOSPITAL_BASED_OUTPATIENT_CLINIC_OR_DEPARTMENT_OTHER)
Admission: RE | Admit: 2019-07-04 | Discharge: 2019-07-04 | Disposition: A | Discharge status: Home / Self Care | Payer: Federal, State, Local not specified - PPO | Attending: Orthopedic Surgery | Admitting: Orthopedic Surgery

## 2019-07-04 ENCOUNTER — Encounter (HOSPITAL_BASED_OUTPATIENT_CLINIC_OR_DEPARTMENT_OTHER): Payer: Self-pay

## 2019-07-04 DIAGNOSIS — Y838 Other surgical procedures as the cause of abnormal reaction of the patient, or of later complication, without mention of misadventure at the time of the procedure: Secondary | ICD-10-CM | POA: Insufficient documentation

## 2019-07-04 DIAGNOSIS — I10 Essential (primary) hypertension: Secondary | ICD-10-CM | POA: Diagnosis not present

## 2019-07-04 DIAGNOSIS — Z79899 Other long term (current) drug therapy: Secondary | ICD-10-CM | POA: Insufficient documentation

## 2019-07-04 DIAGNOSIS — Z9889 Other specified postprocedural states: Secondary | ICD-10-CM | POA: Diagnosis not present

## 2019-07-04 DIAGNOSIS — E119 Type 2 diabetes mellitus without complications: Secondary | ICD-10-CM | POA: Diagnosis not present

## 2019-07-04 DIAGNOSIS — M7022 Olecranon bursitis, left elbow: Secondary | ICD-10-CM | POA: Diagnosis present

## 2019-07-04 DIAGNOSIS — Z7984 Long term (current) use of oral hypoglycemic drugs: Secondary | ICD-10-CM | POA: Diagnosis not present

## 2019-07-04 DIAGNOSIS — T8131XA Disruption of external operation (surgical) wound, not elsewhere classified, initial encounter: Secondary | ICD-10-CM | POA: Insufficient documentation

## 2019-07-04 HISTORY — PX: OLECRANON BURSECTOMY: SHX2097

## 2019-07-04 LAB — GLUCOSE, CAPILLARY
Glucose-Capillary: 132 mg/dL — ABNORMAL HIGH (ref 70–99)
Glucose-Capillary: 124 mg/dL — ABNORMAL HIGH (ref 70–99)

## 2019-07-04 SURGERY — BURSECTOMY, ELBOW
Anesthesia: General | Site: Elbow | Laterality: Left

## 2019-07-04 MED ORDER — PROPOFOL 10 MG/ML IV BOLUS
INTRAVENOUS | Status: DC | PRN
  Administered 2019-07-04: 200 mg via INTRAVENOUS

## 2019-07-04 MED ORDER — MEPERIDINE HCL 25 MG/ML IJ SOLN: 6.2500 mg | INTRAMUSCULAR | Status: DC | PRN

## 2019-07-04 MED ORDER — MIDAZOLAM HCL 2 MG/2ML IJ SOLN
INTRAMUSCULAR | Status: AC
  Filled 2019-07-04: qty 2

## 2019-07-04 MED ORDER — OXYCODONE HCL 5 MG/5ML PO SOLN: 5.0000 mg | Freq: Once | ORAL | Status: AC | PRN

## 2019-07-04 MED ORDER — OXYCODONE HCL 5 MG PO TABS
ORAL_TABLET | ORAL | Status: AC
  Filled 2019-07-04: qty 1

## 2019-07-04 MED ORDER — CEFAZOLIN SODIUM-DEXTROSE 2-4 GM/100ML-% IV SOLN: 2.0000 g | INTRAVENOUS | Status: DC

## 2019-07-04 MED ORDER — FENTANYL CITRATE (PF) 100 MCG/2ML IJ SOLN
INTRAMUSCULAR | Status: AC
  Filled 2019-07-04: qty 2

## 2019-07-04 MED ORDER — ONDANSETRON HCL 4 MG/2ML IJ SOLN
INTRAMUSCULAR | Status: AC
  Filled 2019-07-04: qty 2

## 2019-07-04 MED ORDER — PROPOFOL 10 MG/ML IV BOLUS
INTRAVENOUS | Status: AC
  Filled 2019-07-04: qty 20

## 2019-07-04 MED ORDER — DEXAMETHASONE SODIUM PHOSPHATE 10 MG/ML IJ SOLN
INTRAMUSCULAR | Status: AC
  Filled 2019-07-04: qty 1

## 2019-07-04 MED ORDER — EPHEDRINE SULFATE 50 MG/ML IJ SOLN
INTRAMUSCULAR | Status: DC | PRN
  Administered 2019-07-04: 10 mg via INTRAVENOUS

## 2019-07-04 MED ORDER — LACTATED RINGERS IV SOLN
INTRAVENOUS | Status: DC
  Administered 2019-07-04: 08:00:00 via INTRAVENOUS

## 2019-07-04 MED ORDER — LIDOCAINE 2% (20 MG/ML) 5 ML SYRINGE
INTRAMUSCULAR | Status: DC | PRN
  Administered 2019-07-04: 80 mg via INTRAVENOUS

## 2019-07-04 MED ORDER — FENTANYL CITRATE (PF) 100 MCG/2ML IJ SOLN: 25.0000 ug | INTRAMUSCULAR | Status: DC | PRN

## 2019-07-04 MED ORDER — DEXAMETHASONE SODIUM PHOSPHATE 4 MG/ML IJ SOLN
INTRAMUSCULAR | Status: DC | PRN
  Administered 2019-07-04: 4 mg via INTRAVENOUS

## 2019-07-04 MED ORDER — LIDOCAINE 2% (20 MG/ML) 5 ML SYRINGE
INTRAMUSCULAR | Status: AC
  Filled 2019-07-04: qty 5

## 2019-07-04 MED ORDER — ACETAMINOPHEN 325 MG PO TABS: 325.0000 mg | ORAL_TABLET | ORAL | Status: DC | PRN

## 2019-07-04 MED ORDER — POVIDONE-IODINE 7.5 % EX SOLN: Freq: Once | CUTANEOUS | Status: DC

## 2019-07-04 MED ORDER — MIDAZOLAM HCL 2 MG/2ML IJ SOLN
1.0000 mg | INTRAMUSCULAR | Status: DC | PRN
  Administered 2019-07-04: 2 mg via INTRAVENOUS

## 2019-07-04 MED ORDER — FENTANYL CITRATE (PF) 100 MCG/2ML IJ SOLN
50.0000 ug | INTRAMUSCULAR | Status: DC | PRN
  Administered 2019-07-04: 10:00:00 50 ug via INTRAVENOUS

## 2019-07-04 MED ORDER — OXYCODONE HCL 5 MG PO TABS
5.0000 mg | ORAL_TABLET | Freq: Once | ORAL | Status: AC | PRN
  Administered 2019-07-04: 5 mg via ORAL

## 2019-07-04 MED ORDER — CEFAZOLIN SODIUM-DEXTROSE 2-4 GM/100ML-% IV SOLN
INTRAVENOUS | Status: AC
  Filled 2019-07-04: qty 100

## 2019-07-04 MED ORDER — VANCOMYCIN HCL IN DEXTROSE 1-5 GM/200ML-% IV SOLN
1000.0000 mg | INTRAVENOUS | Status: AC
  Administered 2019-07-04: 10:00:00 1000 mg via INTRAVENOUS

## 2019-07-04 MED ORDER — ONDANSETRON HCL 4 MG/2ML IJ SOLN
INTRAMUSCULAR | Status: DC | PRN
  Administered 2019-07-04: 4 mg via INTRAVENOUS

## 2019-07-04 MED ORDER — LACTATED RINGERS IV SOLN: INTRAVENOUS | Status: DC

## 2019-07-04 MED ORDER — TRAMADOL HCL 50 MG PO TABS: 50.0000 mg | ORAL_TABLET | Freq: Four times a day (QID) | ORAL | 0 refills | Status: AC | PRN

## 2019-07-04 MED ORDER — POVIDONE-IODINE 10 % EX SWAB: 2.0000 "application " | Freq: Once | CUTANEOUS | Status: DC

## 2019-07-04 MED ORDER — VANCOMYCIN HCL IN DEXTROSE 1-5 GM/200ML-% IV SOLN
INTRAVENOUS | Status: AC
  Filled 2019-07-04: qty 200

## 2019-07-04 MED ORDER — BUPIVACAINE HCL (PF) 0.25 % IJ SOLN
INTRAMUSCULAR | Status: AC
  Filled 2019-07-04: qty 30

## 2019-07-04 MED ORDER — SCOPOLAMINE 1 MG/3DAYS TD PT72: 1.0000 | MEDICATED_PATCH | Freq: Once | TRANSDERMAL | Status: DC

## 2019-07-04 MED ORDER — ACETAMINOPHEN 160 MG/5ML PO SOLN: 325.0000 mg | ORAL | Status: DC | PRN

## 2019-07-04 MED ORDER — ONDANSETRON HCL 4 MG/2ML IJ SOLN: 4.0000 mg | Freq: Once | INTRAMUSCULAR | Status: DC | PRN

## 2019-07-04 SURGICAL SUPPLY — 78 items
BLADE SURG 15 STRL LF DISP TIS (BLADE) ×1 IMPLANT
BLADE SURG 15 STRL SS (BLADE) ×6
BNDG CMPR 9X4 STRL LF SNTH (GAUZE/BANDAGES/DRESSINGS) ×1
BNDG COHESIVE 4X5 TAN STRL (GAUZE/BANDAGES/DRESSINGS) ×3 IMPLANT
BNDG ELASTIC 4X5.8 VLCR STR LF (GAUZE/BANDAGES/DRESSINGS) ×3 IMPLANT
BNDG ELASTIC 6X5.8 VLCR STR LF (GAUZE/BANDAGES/DRESSINGS) ×3 IMPLANT
BNDG ESMARK 4X9 LF (GAUZE/BANDAGES/DRESSINGS) ×3 IMPLANT
BRUSH SCRUB EZ PLAIN DRY (MISCELLANEOUS) ×2 IMPLANT
CLOSURE WOUND 1/2 X4 (GAUZE/BANDAGES/DRESSINGS) ×1
COVER BACK TABLE REUSABLE LG (DRAPES) ×3 IMPLANT
COVER MAYO STAND REUSABLE (DRAPES) ×1 IMPLANT
COVER WAND RF STERILE (DRAPES) IMPLANT
DRAPE EXTREMITY T 121X128X90 (DISPOSABLE) ×3 IMPLANT
DRAPE INCISE IOBAN 66X45 STRL (DRAPES) ×2 IMPLANT
DRAPE U-SHAPE 47X51 STRL (DRAPES) ×3 IMPLANT
DRSG EMULSION OIL 3X3 NADH (GAUZE/BANDAGES/DRESSINGS) ×3 IMPLANT
DURAPREP 26ML APPLICATOR (WOUND CARE) ×3 IMPLANT
ELECT REM PT RETURN 9FT ADLT (ELECTROSURGICAL) ×3
ELECTRODE REM PT RTRN 9FT ADLT (ELECTROSURGICAL) ×1 IMPLANT
GAUZE SPONGE 4X4 12PLY STRL (GAUZE/BANDAGES/DRESSINGS) ×3 IMPLANT
GAUZE XEROFORM 1X8 LF (GAUZE/BANDAGES/DRESSINGS) ×1 IMPLANT
GLOVE BIO SURGEON STRL SZ7 (GLOVE) ×3 IMPLANT
GLOVE BIOGEL M STRL SZ7.5 (GLOVE) ×2 IMPLANT
GLOVE BIOGEL PI IND STRL 7.0 (GLOVE) ×1 IMPLANT
GLOVE BIOGEL PI IND STRL 7.5 (GLOVE) ×1 IMPLANT
GLOVE BIOGEL PI IND STRL 8 (GLOVE) IMPLANT
GLOVE BIOGEL PI INDICATOR 7.0 (GLOVE) ×2
GLOVE BIOGEL PI INDICATOR 7.5 (GLOVE) ×2
GLOVE BIOGEL PI INDICATOR 8 (GLOVE) ×2
GLOVE SS BIOGEL STRL SZ 7.5 (GLOVE) ×1 IMPLANT
GLOVE SUPERSENSE BIOGEL SZ 7.5 (GLOVE) ×2
GOWN STRL REUS W/ TWL LRG LVL3 (GOWN DISPOSABLE) ×2 IMPLANT
GOWN STRL REUS W/ TWL XL LVL3 (GOWN DISPOSABLE) ×1 IMPLANT
GOWN STRL REUS W/TWL LRG LVL3 (GOWN DISPOSABLE) ×6
GOWN STRL REUS W/TWL XL LVL3 (GOWN DISPOSABLE) ×6
KIT DRSG PREVENA PLUS 7DAY 125 (MISCELLANEOUS) ×2 IMPLANT
NDL ADDISON D1/2 CIR (NEEDLE) IMPLANT
NDL HYPO 25X1 1.5 SAFETY (NEEDLE) ×1 IMPLANT
NDL SUT 6 .5 CRC .975X.05 MAYO (NEEDLE) IMPLANT
NEEDLE ADDISON D1/2 CIR (NEEDLE) IMPLANT
NEEDLE HYPO 25X1 1.5 SAFETY (NEEDLE) ×3 IMPLANT
NEEDLE MAYO TAPER (NEEDLE)
NS IRRIG 1000ML POUR BTL (IV SOLUTION) ×3 IMPLANT
PACK BASIN DAY SURGERY FS (CUSTOM PROCEDURE TRAY) ×3 IMPLANT
PAD CAST 3X4 CTTN HI CHSV (CAST SUPPLIES) ×1 IMPLANT
PAD CAST 4YDX4 CTTN HI CHSV (CAST SUPPLIES) ×1 IMPLANT
PADDING CAST ABS 4INX4YD NS (CAST SUPPLIES) ×2
PADDING CAST ABS COTTON 4X4 ST (CAST SUPPLIES) ×1 IMPLANT
PADDING CAST COTTON 3X4 STRL (CAST SUPPLIES) ×3
PADDING CAST COTTON 4X4 STRL (CAST SUPPLIES) ×3
PASSER SUT SWANSON 36MM LOOP (INSTRUMENTS) IMPLANT
PENCIL BUTTON HOLSTER BLD 10FT (ELECTRODE) ×3 IMPLANT
SLEEVE SCD COMPRESS KNEE MED (MISCELLANEOUS) ×2 IMPLANT
SPLINT FAST PLASTER 5X30 (CAST SUPPLIES) ×20
SPLINT PLASTER CAST FAST 5X30 (CAST SUPPLIES) IMPLANT
SPLINT PLASTER CAST XFAST 4X15 (CAST SUPPLIES) IMPLANT
SPLINT PLASTER XTRA FAST SET 4 (CAST SUPPLIES)
STOCKINETTE 4X48 STRL (DRAPES) ×1 IMPLANT
STOCKINETTE IMPERVIOUS LG (DRAPES) ×3 IMPLANT
STRIP CLOSURE SKIN 1/2X4 (GAUZE/BANDAGES/DRESSINGS) ×2 IMPLANT
SUCTION FRAZIER HANDLE 10FR (MISCELLANEOUS) ×2
SUCTION TUBE FRAZIER 10FR DISP (MISCELLANEOUS) IMPLANT
SUT ETHILON 2 0 FS 18 (SUTURE) ×4 IMPLANT
SUT ETHILON 4 0 PS 2 18 (SUTURE) ×1 IMPLANT
SUT ETHILON 5 0 P 3 18 (SUTURE)
SUT FIBERWIRE 2-0 18 17.9 3/8 (SUTURE)
SUT NYLON ETHILON 5-0 P-3 1X18 (SUTURE) IMPLANT
SUT PROLENE 3 0 PS 2 (SUTURE) IMPLANT
SUT VIC AB 0 CT1 27 (SUTURE)
SUT VIC AB 0 CT1 27XBRD ANBCTR (SUTURE) IMPLANT
SUTURE FIBERWR 2-0 18 17.9 3/8 (SUTURE) IMPLANT
SYR BULB 3OZ (MISCELLANEOUS) ×3 IMPLANT
SYR CONTROL 10ML LL (SYRINGE) ×3 IMPLANT
TOWEL GREEN STERILE FF (TOWEL DISPOSABLE) ×6 IMPLANT
TRAY DSU PREP LF (CUSTOM PROCEDURE TRAY) ×2 IMPLANT
TUBE CONNECTING 20'X1/4 (TUBING) ×1
TUBE CONNECTING 20X1/4 (TUBING) ×1 IMPLANT
UNDERPAD 30X30 (UNDERPADS AND DIAPERS) ×5 IMPLANT

## 2019-07-04 NOTE — Anesthesia Postprocedure Evaluation (Signed)
Anesthesia Post Note  Patient: Lawrence Keith  Procedure(s) Performed: OLECRANON BURSECTOMY, LEFT ELBOW INCISION AND DRAINAGE DEEP BURSA (Left Elbow)     Patient location during evaluation: PACU Anesthesia Type: General Level of consciousness: awake and alert Pain management: pain level controlled Vital Signs Assessment: post-procedure vital signs reviewed and stable Respiratory status: spontaneous breathing, nonlabored ventilation, respiratory function stable and patient connected to nasal cannula oxygen Cardiovascular status: blood pressure returned to baseline and stable Postop Assessment: no apparent nausea or vomiting Anesthetic complications: no    Last Vitals:  Vitals:   07/04/19 1045 07/04/19 1100  BP: 126/80 130/77  Pulse: 75 73  Resp: 12 13  Temp:    SpO2: 100% 95%    Last Pain:  Vitals:   07/04/19 1125  TempSrc:   PainSc: 3                  Zahid Carneiro

## 2019-07-04 NOTE — Anesthesia Procedure Notes (Signed)
Procedure Name: LMA Insertion Date/Time: 07/04/2019 9:31 AM Performed by: Maryella Shivers, CRNA Pre-anesthesia Checklist: Patient identified, Emergency Drugs available, Suction available and Patient being monitored Patient Re-evaluated:Patient Re-evaluated prior to induction Oxygen Delivery Method: Circle system utilized Preoxygenation: Pre-oxygenation with 100% oxygen Induction Type: IV induction Ventilation: Mask ventilation without difficulty LMA: LMA inserted LMA Size: 5.0 Number of attempts: 1 Airway Equipment and Method: Bite block Placement Confirmation: positive ETCO2 Tube secured with: Tape Dental Injury: Teeth and Oropharynx as per pre-operative assessment

## 2019-07-04 NOTE — Anesthesia Preprocedure Evaluation (Signed)
Anesthesia Evaluation  Patient identified by MRN, date of birth, ID band Patient awake    Reviewed: Allergy & Precautions, H&P , NPO status , Patient's Chart, lab work & pertinent test results, reviewed documented beta blocker date and time   Airway Mallampati: II  TM Distance: >3 FB Neck ROM: full    Dental no notable dental hx.    Pulmonary neg pulmonary ROS,    Pulmonary exam normal breath sounds clear to auscultation       Cardiovascular Exercise Tolerance: Good hypertension, Pt. on medications  Rhythm:regular Rate:Normal     Neuro/Psych negative neurological ROS  negative psych ROS   GI/Hepatic negative GI ROS, Neg liver ROS,   Endo/Other  negative endocrine ROSdiabetes, Oral Hypoglycemic Agents  Renal/GU negative Renal ROS  negative genitourinary   Musculoskeletal   Abdominal   Peds  Hematology negative hematology ROS (+)   Anesthesia Other Findings   Reproductive/Obstetrics negative OB ROS                             Anesthesia Physical Anesthesia Plan  ASA: III  Anesthesia Plan: General   Post-op Pain Management:    Induction: Intravenous  PONV Risk Score and Plan: 2 and Ondansetron and Treatment may vary due to age or medical condition  Airway Management Planned: Oral ETT and LMA  Additional Equipment:   Intra-op Plan:   Post-operative Plan:   Informed Consent: I have reviewed the patients History and Physical, chart, labs and discussed the procedure including the risks, benefits and alternatives for the proposed anesthesia with the patient or authorized representative who has indicated his/her understanding and acceptance.     Dental Advisory Given  Plan Discussed with: CRNA  Anesthesia Plan Comments: ( )        Anesthesia Quick Evaluation

## 2019-07-04 NOTE — Op Note (Signed)
NAME: MOISE, FRIDAY MEDICAL RECORD [id number] ACCOUNT [id number] DATE OF BIRTH:Jan 22, 1951 FACILITY: [facility] LOCATION: [facility] PHYSICIAN:Jesaiah Fabiano Venetia Maxon, MD  OPERATIVE REPORT  DATE OF PROCEDURE:  07/04/2019  PREOPERATIVE DIAGNOSES:  Left elbow wound dehiscence status post olecranon bursectomy.  POSTOPERATIVE DIAGNOSIS:  Left elbow wound dehiscence status post olecranon bursectomy.  POSTOPERATIVE DIAGNOSES:  Left elbow wound dehiscence status post olecranon bursectomy with left elbow recurrent olecranon bursitis.  PROCEDURE: 1.  Left elbow examination under anesthesia followed by a revision olecranon bursectomy. 2.  Left elbow wound dehiscence closure with debridement. 3.  Left elbow wound vacuum-assisted closure application.  SURGEON:  Elsie Saas, MD  ASSISTANT:  Matthew Saras, PA  ANESTHESIA:  General.  OPERATIVE TIME:  40 minutes.  COMPLICATIONS:  None.  INDICATIONS:  The patient is a 68 year old who is 10 weeks status post left elbow olecranon bursectomy who has had 2 wound dehiscences of his incision since the initial surgery.  The first wound dehiscence was closed in the office, but then he dehisced  this wound up 3 weeks later and is now to undergo formal revision of his olecranon bursectomy with wound debridement and wound dehiscence repair with wound VAC application.  DESCRIPTION OF PROCEDURE:  The patient was brought to the operating room on 07/04/2019, placed on the operating table in supine position.  After being placed under general anesthesia, his left elbow was examined.  He had full range of motion and his  elbow was stable.  He had a small persistent wound dehiscence measuring 1 cm x 1 cm.  The left arm was prepped using sterile Betadine and draped using sterile technique.  Time-out procedure was called, and the correct left elbow identified.  Initially,  the dehisced wound area was elliptically excised and a new incision made, thus getting back  to healthy-appearing skin edges.  Underneath this, there was exposed olecranon bone from his previous bursectomy and olecranon spur excision, and this area of  bone was further rongeured and smoothed out with a rasp.  After this was done, then a small amount of remaining bursal tissue was removed.  Neurovascular structures were carefully protected.  The wound was then thoroughly irrigated, and then a special  mattress suture technique was used to close the incision with a 2-0 nylon suture.  Sterile dressings and a wound VAC were applied, and then a long arm splint was applied as well.  After this was done, the patient was then awakened and taken to recovery  room in stable condition.  Needle and sponge counts were correct x2 at the end of the case.  FOLLOWUP CARE:  The patient will be followed as an outpatient on tramadol and Avelox for antibiotics.  He will be seen back in the office in a week for recheck and followup.  LN/NUANCE  D:07/04/2019 T:07/04/2019 JOB:[id number]

## 2019-07-04 NOTE — Interval H&P Note (Signed)
History and Physical Interval Note:  07/04/2019 9:00 AM  Lawrence Keith  has presented today for surgery, with the diagnosis of [place].  The various methods of treatment have been discussed with the patient and family. After consideration of risks, benefits and other options for treatment, the patient has consented to  Procedure(s): OLECRANON BURSECTOMY, LEFT [place] (Left) as a surgical intervention.  The patient's history has been reviewed, patient examined, no change in status, stable for surgery.  I have reviewed the patient's chart and labs.  Questions were answered to the patient's satisfaction.     Lorn Junes

## 2019-07-04 NOTE — Transfer of Care (Signed)
Immediate Anesthesia Transfer of Care Note  Patient: Lawrence Keith  Procedure(s) Performed: Ree Shay BURSECTOMY, LEFT ELBOW INCISION AND DRAINAGE DEEP BURSA (Left Elbow)  Patient Location: PACU  Anesthesia Type:General  Level of Consciousness: sedated  Airway & Oxygen Therapy: Patient Spontanous Breathing and Patient connected to nasal cannula oxygen  Post-op Assessment: Report given to RN and Post -op Vital signs reviewed and stable  Post vital signs: Reviewed and stable  Last Vitals:  Vitals Value Taken Time  BP 132/71 07/04/19 1016  Temp    Pulse    Resp 13 07/04/19 1019  SpO2    Vitals shown include unvalidated device data.  Last Pain:  Vitals:   07/04/19 0803  TempSrc: Oral  PainSc: 0-No pain         Complications: No apparent anesthesia complications

## 2019-07-04 NOTE — Discharge Instructions (Signed)
5mg  Oxycodone taken at 11:25am  Post Anesthesia Home Care Instructions  Activity: Get plenty of rest for the remainder of the day. A responsible individual must stay with you for 24 hours following the procedure.  For the next 24 hours, DO NOT: -Drive a car -Paediatric nurse -Drink alcoholic beverages -Take any medication unless instructed by your physician -Make any legal decisions or sign important papers.  Meals: Start with liquid foods such as gelatin or soup. Progress to regular foods as tolerated. Avoid greasy, spicy, heavy foods. If nausea and/or vomiting occur, drink only clear liquids until the nausea and/or vomiting subsides. Call your physician if vomiting continues.  Special Instructions/Symptoms: Your throat may feel dry or sore from the anesthesia or the breathing tube placed in your throat during surgery. If this causes discomfort, gargle with warm salt water. The discomfort should disappear within 24 hours.  If you had a scopolamine patch placed behind your ear for the management of post- operative nausea and/or vomiting:  1. The medication in the patch is effective for 72 hours, after which it should be removed.  Wrap patch in a tissue and discard in the trash. Wash hands thoroughly with soap and water. 2. You may remove the patch earlier than 72 hours if you experience unpleasant side effects which may include dry mouth, dizziness or visual disturbances. 3. Avoid touching the patch. Wash your hands with soap and water after contact with the patch.

## 2019-07-05 ENCOUNTER — Encounter (HOSPITAL_BASED_OUTPATIENT_CLINIC_OR_DEPARTMENT_OTHER): Payer: Self-pay | Admitting: Orthopedic Surgery

## 2020-01-12 ENCOUNTER — Ambulatory Visit: Payer: Federal, State, Local not specified - PPO

## 2020-01-17 ENCOUNTER — Ambulatory Visit: Payer: Federal, State, Local not specified - PPO

## 2020-01-19 ENCOUNTER — Ambulatory Visit: Payer: Federal, State, Local not specified - PPO | Attending: Internal Medicine

## 2020-01-19 DIAGNOSIS — Z23 Encounter for immunization: Secondary | ICD-10-CM | POA: Insufficient documentation

## 2020-01-19 NOTE — Progress Notes (Signed)
   Covid-19 Vaccination Clinic  Name:  Lawrence Keith    MRN: [id number] DOB: [birth year]  01/19/2020  Mr. Lawrence Keith was observed post Covid-19 immunization for 15 minutes without incidence. He was provided with Vaccine Information Sheet and instruction to access the V-Safe system.   Mr. Lawrence Keith was instructed to call 911 with any severe reactions post vaccine: Marland Kitchen Difficulty breathing  . Swelling of your face and throat  . A fast heartbeat  . A bad rash all over your body  . Dizziness and weakness    Immunizations Administered    Name Date Dose VIS Date Route   Pfizer COVID-19 Vaccine 01/19/2020  5:56 PM 0.3 mL 11/23/2019 Intramuscular   Manufacturer: [employer], [employer]   Lot: [id number]   NDC: [address]-8

## 2020-02-13 ENCOUNTER — Ambulatory Visit: Payer: Federal, State, Local not specified - PPO | Attending: Internal Medicine

## 2020-02-13 DIAGNOSIS — Z23 Encounter for immunization: Secondary | ICD-10-CM | POA: Insufficient documentation

## 2020-02-13 NOTE — Progress Notes (Signed)
   Covid-19 Vaccination Clinic  Name:  Lawrence Keith    MRN: [id number] DOB: Sep 12, 1951  02/13/2020  [name] was observed post Covid-19 immunization for 15 minutes without incident. He was provided with Vaccine Information Sheet and instruction to access the V-Safe system.   [name] was instructed to call 911 with any severe reactions post vaccine: Marland Kitchen Difficulty breathing  . Swelling of face and throat  . A fast heartbeat  . A bad rash all over body  . Dizziness and weakness   Immunizations Administered    Name Date Dose VIS Date Route   Pfizer COVID-19 Vaccine 02/13/2020  2:10 PM 0.3 mL 11/23/2019 Intramuscular   Manufacturer: [employer], [employer]   Lot: [id number]   NDC: [address]-2

## 2023-04-21 ENCOUNTER — Other Ambulatory Visit: Payer: Self-pay | Admitting: Urology

## 2023-04-27 NOTE — Progress Notes (Signed)
Talked with patient. Hx and meds reviewed. NPO after MN. Arrival time 0800. Driver is secured. Stop all routine  meds day before surgery.bring in blue folder

## 2023-04-29 ENCOUNTER — Encounter (HOSPITAL_BASED_OUTPATIENT_CLINIC_OR_DEPARTMENT_OTHER): Payer: Self-pay | Admitting: Urology

## 2023-04-29 ENCOUNTER — Other Ambulatory Visit: Payer: Self-pay

## 2023-04-29 ENCOUNTER — Encounter (HOSPITAL_BASED_OUTPATIENT_CLINIC_OR_DEPARTMENT_OTHER)
Admission: RE | Disposition: A | Discharge status: Home / Self Care | Payer: Self-pay | Source: Home / Self Care | Attending: Urology

## 2023-04-29 ENCOUNTER — Ambulatory Visit (HOSPITAL_COMMUNITY): Payer: Medicare Other

## 2023-04-29 ENCOUNTER — Ambulatory Visit (HOSPITAL_BASED_OUTPATIENT_CLINIC_OR_DEPARTMENT_OTHER)
Admission: RE | Admit: 2023-04-29 | Discharge: 2023-04-29 | Disposition: A | Discharge status: Home / Self Care | Payer: Medicare Other | Attending: Urology | Admitting: Urology

## 2023-04-29 DIAGNOSIS — N201 Calculus of ureter: Secondary | ICD-10-CM | POA: Insufficient documentation

## 2023-04-29 DIAGNOSIS — E119 Type 2 diabetes mellitus without complications: Secondary | ICD-10-CM | POA: Insufficient documentation

## 2023-04-29 DIAGNOSIS — N200 Calculus of kidney: Secondary | ICD-10-CM

## 2023-04-29 DIAGNOSIS — Z7984 Long term (current) use of oral hypoglycemic drugs: Secondary | ICD-10-CM | POA: Insufficient documentation

## 2023-04-29 DIAGNOSIS — N202 Calculus of kidney with calculus of ureter: Secondary | ICD-10-CM | POA: Diagnosis present

## 2023-04-29 HISTORY — PX: EXTRACORPOREAL SHOCK WAVE LITHOTRIPSY: SHX1557

## 2023-04-29 LAB — GLUCOSE, CAPILLARY: Glucose-Capillary: 112 mg/dL — ABNORMAL HIGH (ref 70–99)

## 2023-04-29 SURGERY — LITHOTRIPSY, ESWL
Anesthesia: LOCAL | Laterality: Left

## 2023-04-29 MED ORDER — DIPHENHYDRAMINE HCL 25 MG PO CAPS
ORAL_CAPSULE | ORAL | Status: AC
  Filled 2023-04-29: qty 1

## 2023-04-29 MED ORDER — TRAMADOL HCL 50 MG PO TABS: 50.0000 mg | ORAL_TABLET | Freq: Four times a day (QID) | ORAL | 0 refills | Status: AC | PRN

## 2023-04-29 MED ORDER — SODIUM CHLORIDE 0.9 % IV SOLN
INTRAVENOUS | Status: DC
  Administered 2023-04-29: 1000 mL via INTRAVENOUS

## 2023-04-29 MED ORDER — CIPROFLOXACIN HCL 500 MG PO TABS
500.0000 mg | ORAL_TABLET | ORAL | Status: AC
  Administered 2023-04-29: 500 mg via ORAL

## 2023-04-29 MED ORDER — DIAZEPAM 5 MG PO TABS
ORAL_TABLET | ORAL | Status: AC
  Filled 2023-04-29: qty 2

## 2023-04-29 MED ORDER — DIPHENHYDRAMINE HCL 25 MG PO CAPS
25.0000 mg | ORAL_CAPSULE | ORAL | Status: AC
  Administered 2023-04-29: 25 mg via ORAL

## 2023-04-29 MED ORDER — DIAZEPAM 5 MG PO TABS
10.0000 mg | ORAL_TABLET | ORAL | Status: AC
  Administered 2023-04-29: 10 mg via ORAL

## 2023-04-29 MED ORDER — CIPROFLOXACIN HCL 500 MG PO TABS
ORAL_TABLET | ORAL | Status: AC
  Filled 2023-04-29: qty 1

## 2023-04-29 NOTE — Discharge Instructions (Signed)
See [facility] discharge instructions in chart.  

## 2023-04-29 NOTE — H&P (Signed)
03/18/2023: New/old pt. seen today as a referral from primary care due to recent diagnosis of a mildly obstructing left 4 mm mid ureteral calculi. CT imaging ordered by primary care on 4/2 showed small bilateral nonobstructing calculi as well. This is patient's first stone event. He was seen back in 2017 for evaluation of erectile dysfunction. He denies prior history of significantly bothersome BPH symptoms, UTI/prostatitis. Current symptoms began approximately 1 week ago with some left flank pain radiating into the left lower quadrant of the abdomen. He saw primary care who told him he had some blood in his urine and ordered the CT scan. Patient was given 800 mg ibuprofen tablets which has been effective in controlling his pain and discomfort. He denies any true colic symptoms. He has not had any nausea or vomiting, fevers or chills. He denies any bothersome lower urinary tract symptoms including increased urgency, changes in force of stream, dysuria or gross hematuria.   04/01/2023: Patient returns today for ongoing evaluation of a previously identified left ureteral calculi. He continues tamsulosin. He has been mostly asymptomatic since time of last exam. He has had no significant exacerbations of left-sided pain and discomfort. He has not had any dysuria or gross hematuria. He is voiding at his baseline without any dysuria or gross hematuria. He does state he has passed a couple small stone pieces in his urine strainer without any noted difficulty. Denies any interval fevers or chills, nausea/vomiting. He is tolerating tamsulosin well but has had some expected retrograde ejaculation which I provided reassurance against.   04/20/2023: Returns for follow-up exam with imaging. He continues with a distal left ureteral calculi but remains asymptomatic in regards to this. No interval recurrence of pain/discomfort, bothersome LUTS including absence of any dysuria or gross hematuria. No interval stone material  passage by his report. No interval fevers or chills, nausea/vomiting. UA today is within normal limits, no continued obstructive signs on left renal ultrasound. KUB easily shows continued distal left ureteral calculi.     ALLERGIES: No Allergies    MEDICATIONS: Ketorolac Tromethamine 10 mg tablet 1 tablet PO Q 8 H PRN  Tamsulosin Hcl 0.4 mg capsule 1 capsule PO Daily  Alprazolam 0.5 mg tablet Oral  Hydrocodone-Acetaminophen 5 mg-325 mg tablet 1 tablet PO Q 6 H PRN Take if pain not controlled by ketorolac  Losartan Potassium 25 mg tablet Oral  Metformin Hcl 1,000 mg tablet Oral  Pioglitazone Hcl 15 mg tablet Oral     GU PSH: No GU PSH      PSH Notes: Knee Surgery Left, elbow surgery   NON-GU PSH: Cholecystectomy (laparoscopic) - 2017     GU PMH: Renal calculus - 04/01/2023, - 03/18/2023 Ureteral calculus - 04/01/2023, - 03/18/2023 Ureteral obstruction secondary to calculous - 04/01/2023, - 03/18/2023 ED due to arterial insufficiency, Erectile dysfunction due to arterial insufficiency - 2017 Peyronies Disease, Peyronie's disease - 2017    NON-GU PMH: Encounter for general adult medical examination without abnormal findings, Encounter for preventive health examination - 2017 Personal history of other endocrine, nutritional and metabolic disease, History of diabetes mellitus - 2017 Diabetes Type 2    FAMILY HISTORY: 3 daughters - Other Death of family member - Runs In Family   SOCIAL HISTORY: Marital Status: Married Preferred Language: English; Ethnicity: Not Hispanic Or Latino; Race: White Current Smoking Status: Patient has never smoked.   Tobacco Use Assessment Completed: Used Tobacco in last 30 days? Does not use smokeless tobacco. Has never drank.  Does not use  drugs. Drinks 4+ caffeinated drinks per day.     Notes: Never smoked tobacco, Alcohol use, Married, Caffeine use, Occupation, Number of children   REVIEW OF SYSTEMS:    GU Review Male:   Patient denies frequent  urination, hard to postpone urination, burning/ pain with urination, get up at night to urinate, leakage of urine, stream starts and stops, trouble starting your stream, have to strain to urinate , erection problems, and penile pain.  Gastrointestinal (Upper):   Patient denies nausea, vomiting, and indigestion/ heartburn.  Gastrointestinal (Lower):   Patient denies diarrhea and constipation.  Constitutional:   Patient denies fever, night sweats, weight loss, and fatigue.  Skin:   Patient denies skin rash/ lesion and itching.  Eyes:   Patient denies blurred vision and double vision.  Ears/ Nose/ Throat:   Patient denies sore throat and sinus problems.  Hematologic/Lymphatic:   Patient denies swollen glands and easy bruising.  Cardiovascular:   Patient denies leg swelling and chest pains.  Respiratory:   Patient denies cough and shortness of breath.  Endocrine:   Patient denies excessive thirst.  Musculoskeletal:   Patient denies joint pain and back pain.  Neurological:   Patient denies headaches and dizziness.  Psychologic:   Patient denies depression and anxiety.   VITAL SIGNS:      04/20/2023 09:54 AM  Weight 195 lb / 88.45 kg  Height 75 in / 190.5 cm  BP 139/80 mmHg  Pulse 63 /min  Temperature 97.3 F / 36.2 C  BMI 24.4 kg/m   MULTI-SYSTEM PHYSICAL EXAMINATION:    Constitutional: Well-nourished. No physical deformities. Normally developed. Good grooming.  Neck: Neck symmetrical, not swollen. Normal tracheal position.  Respiratory: No labored breathing, no use of accessory muscles.   Cardiovascular: Normal temperature, normal extremity pulses, no swelling, no varicosities.  Skin: No paleness, no jaundice, no cyanosis. No lesion, no ulcer, no rash.  Neurologic / Psychiatric: Oriented to time, oriented to place, oriented to person. No depression, no anxiety, no agitation.  Gastrointestinal: No mass, no tenderness, no rigidity, non obese abdomen. No CVA or flank tenderness.   Musculoskeletal: Normal gait and station of head and neck.     Complexity of Data:  Source Of History:  Patient, Medical Record Summary  Records Review:   Previous Doctor Records, Previous Hospital Records, Previous Patient Records  Urine Test Review:   Urinalysis, Urine Culture  X-Ray Review: KUB: Reviewed Films. Discussed With Patient.  Renal Ultrasound (Limited): Reviewed Films. Discussed With Patient.  C.T. Abdomen/Pelvis: Reviewed Films. Reviewed Report.     04/20/23  Urinalysis  Urine Appearance Clear   Urine Color Yellow   Urine Glucose Neg mg/dL  Urine Bilirubin Neg mg/dL  Urine Ketones Neg mg/dL  Urine Specific Gravity 1.025   Urine Blood Neg ery/uL  Urine pH <=5.0   Urine Protein Neg mg/dL  Urine Urobilinogen 0.2 mg/dL  Urine Nitrites Neg   Urine Leukocyte Esterase Neg leu/uL   PROCEDURES:         Renal Ultrasound (Limited) - 09811  Kidney: Left Length: 11.2 cm Depth: 7.7 cm Cortical Width: 1.6 cm Width: 6.1 cm    Left Kidney/Ureter:  No hydro. Non-obstructing calcification LP.  Bladder:  PVR 14.79 ml      Patient confirmed No Neulasta OnPro Device.            KUB - [id number]  A single view of the abdomen is obtained. Compared to last exam, he continues with a distal left ureteral calculi grossly  unchanged in location at the anatomical expected area of the UVJ/UO. An arrow was drawn for better identification.      Patient confirmed No Neulasta OnPro Device.           Urinalysis Dipstick Dipstick Cont'd  Color: Yellow Bilirubin: Neg mg/dL  Appearance: Clear Ketones: Neg mg/dL  Specific Gravity: 1.610 Blood: Neg ery/uL  pH: <=5.0 Protein: Neg mg/dL  Glucose: Neg mg/dL Urobilinogen: 0.2 mg/dL    Nitrites: Neg    Leukocyte Esterase: Neg leu/uL    ASSESSMENT:      ICD-10 Details  1 GU:   Ureteral calculus - N20.1 Left, Acute, Complicated Injury  2   Renal calculus - N20.0 Left, Chronic, Stable   PLAN:           Orders Labs Urine Culture           Schedule Return Visit/Planned Activity: Next Available Appointment - Follow up MD, Schedule Surgery          Document Letter(s):  Created for Patient: Clinical Summary         Notes:   He remains asymptomatic but now ready to pursue a definitive intervention. Repeat discussion regarding lithotripsy versus ureteroscopy with patient's preference for the former. He should be a good candidate given the stones visibility and his body habitus.   I will confirm with his prior urologist. Marybelle Killings turned in to scheduler. He will continue to monitor for new or worsening symptomology, interval stone material passage and in the event they occur, he will contact the office to arrange for further follow-up. He will remain on tamsulosin in the interval.    For shockwave lithotripsy I described the risks which include arrhythmia, kidney contusion, kidney hemorrhage, need for transfusion, long-term risk of diabetes or hypertension, back discomfort, flank ecchymosis, flank abrasion, inability to break up stone, inability to pass stone fragments, Steinstrasse, infection associated with obstructing stones, need for different surgical procedure and possible need for repeat shockwave lithotripsy.

## 2023-04-29 NOTE — Op Note (Signed)
See [facility] OP note scanned into chart. Also because of the size, density, location and other factors that cannot be anticipated I feel this will likely be a staged procedure. This fact supersedes any indication in the scanned [facility] operative note to the contrary.  

## 2023-05-02 ENCOUNTER — Encounter (HOSPITAL_BASED_OUTPATIENT_CLINIC_OR_DEPARTMENT_OTHER): Payer: Self-pay | Admitting: Urology

## 2023-10-06 ENCOUNTER — Ambulatory Visit
Admission: RE | Admit: 2023-10-06 | Discharge: 2023-10-06 | Disposition: A | Discharge status: Home / Self Care | Payer: Federal, State, Local not specified - PPO | Source: Ambulatory Visit | Attending: Internal Medicine | Admitting: Internal Medicine

## 2023-10-06 ENCOUNTER — Other Ambulatory Visit: Payer: Self-pay | Admitting: Internal Medicine

## 2023-10-06 DIAGNOSIS — M5459 Other low back pain: Secondary | ICD-10-CM

## 2024-06-29 ENCOUNTER — Encounter: Payer: Self-pay | Admitting: Advanced Practice Midwife
